# Patient Record
Sex: Male | Born: 1955 | Race: Black or African American | Hispanic: No | Marital: Married | Smoking: Former smoker
Health system: Southern US, Community
[De-identification: ages and names within clinical notes are randomized; demographics above are authoritative.]

## PROBLEM LIST (undated history)

## (undated) DIAGNOSIS — I10 Essential (primary) hypertension: Secondary | ICD-10-CM

## (undated) DIAGNOSIS — L723 Sebaceous cyst: Secondary | ICD-10-CM

## (undated) DIAGNOSIS — E119 Type 2 diabetes mellitus without complications: Secondary | ICD-10-CM

## (undated) DIAGNOSIS — M1990 Unspecified osteoarthritis, unspecified site: Secondary | ICD-10-CM

## (undated) DIAGNOSIS — K37 Unspecified appendicitis: Secondary | ICD-10-CM

## (undated) HISTORY — PX: COLON SURGERY: SHX602

## (undated) HISTORY — DX: Sebaceous cyst: L72.3

## (undated) HISTORY — DX: Unspecified appendicitis: K37

## (undated) HISTORY — PX: JOINT REPLACEMENT: SHX530

---

## 2008-08-11 DIAGNOSIS — E291 Testicular hypofunction: Secondary | ICD-10-CM

## 2011-06-28 DIAGNOSIS — E1169 Type 2 diabetes mellitus with other specified complication: Secondary | ICD-10-CM | POA: Insufficient documentation

## 2011-06-28 DIAGNOSIS — I10 Essential (primary) hypertension: Secondary | ICD-10-CM | POA: Insufficient documentation

## 2011-06-28 DIAGNOSIS — E669 Obesity, unspecified: Secondary | ICD-10-CM | POA: Insufficient documentation

## 2011-06-28 DIAGNOSIS — E119 Type 2 diabetes mellitus without complications: Secondary | ICD-10-CM | POA: Insufficient documentation

## 2011-06-28 DIAGNOSIS — IMO0001 Reserved for inherently not codable concepts without codable children: Secondary | ICD-10-CM | POA: Insufficient documentation

## 2012-04-22 HISTORY — PX: REPAIR OF PERFORATED ULCER: SHX6065

## 2012-04-22 HISTORY — PX: BOWEL RESECTION: SHX1257

## 2012-07-31 ENCOUNTER — Emergency Department: Payer: Self-pay | Admitting: Emergency Medicine

## 2012-07-31 LAB — COMPREHENSIVE METABOLIC PANEL
Alkaline Phosphatase: 68 U/L (ref 50–136)
Anion Gap: 8 (ref 7–16)
BUN: 29 mg/dL — ABNORMAL HIGH (ref 7–18)
Calcium, Total: 8.8 mg/dL (ref 8.5–10.1)
Chloride: 105 mmol/L (ref 98–107)
Co2: 25 mmol/L (ref 21–32)
Creatinine: 1.94 mg/dL — ABNORMAL HIGH (ref 0.60–1.30)
EGFR (African American): 44 — ABNORMAL LOW
EGFR (Non-African Amer.): 38 — ABNORMAL LOW
Potassium: 4 mmol/L (ref 3.5–5.1)
SGOT(AST): 18 U/L (ref 15–37)
SGPT (ALT): 23 U/L (ref 12–78)
Total Protein: 7 g/dL (ref 6.4–8.2)

## 2012-07-31 LAB — CBC
HCT: 37.5 % — ABNORMAL LOW (ref 40.0–52.0)
MCH: 29.9 pg (ref 26.0–34.0)
MCV: 89 fL (ref 80–100)
Platelet: 252 10*3/uL (ref 150–440)
RBC: 4.22 10*6/uL — ABNORMAL LOW (ref 4.40–5.90)
RDW: 14.6 % — ABNORMAL HIGH (ref 11.5–14.5)
WBC: 6.6 10*3/uL (ref 3.8–10.6)

## 2012-07-31 LAB — CK TOTAL AND CKMB (NOT AT ARMC): CK-MB: 2.9 ng/mL (ref 0.5–3.6)

## 2012-07-31 LAB — LIPASE, BLOOD: Lipase: 70 U/L — ABNORMAL LOW (ref 73–393)

## 2013-01-08 DIAGNOSIS — N529 Male erectile dysfunction, unspecified: Secondary | ICD-10-CM | POA: Insufficient documentation

## 2013-01-20 ENCOUNTER — Inpatient Hospital Stay: Payer: Self-pay | Admitting: Surgery

## 2013-01-20 LAB — URINALYSIS, COMPLETE
Bilirubin,UR: NEGATIVE
Blood: NEGATIVE
Leukocyte Esterase: NEGATIVE
Nitrite: NEGATIVE
Ph: 5 (ref 4.5–8.0)
Protein: 30
RBC,UR: <1 /HPF (ref 0–5)
Specific Gravity: 1.016 (ref 1.003–1.030)

## 2013-01-20 LAB — COMPREHENSIVE METABOLIC PANEL
Albumin: 3.1 g/dL — ABNORMAL LOW (ref 3.4–5.0)
Alkaline Phosphatase: 155 U/L — ABNORMAL HIGH (ref 50–136)
Anion Gap: 8 (ref 7–16)
BUN: 26 mg/dL — ABNORMAL HIGH (ref 7–18)
Calcium, Total: 9.7 mg/dL (ref 8.5–10.1)
Chloride: 102 mmol/L (ref 98–107)
Co2: 25 mmol/L (ref 21–32)
EGFR (African American): 56 — ABNORMAL LOW
EGFR (Non-African Amer.): 49 — ABNORMAL LOW
Glucose: 139 mg/dL — ABNORMAL HIGH (ref 65–99)
Potassium: 4.1 mmol/L (ref 3.5–5.1)
SGOT(AST): 33 U/L (ref 15–37)
SGPT (ALT): 52 U/L (ref 12–78)
Sodium: 135 mmol/L — ABNORMAL LOW (ref 136–145)
Total Protein: 8.2 g/dL (ref 6.4–8.2)

## 2013-01-20 LAB — CBC
HCT: 32.8 % — ABNORMAL LOW (ref 40.0–52.0)
HGB: 10.9 g/dL — ABNORMAL LOW (ref 13.0–18.0)
MCV: 83 fL (ref 80–100)
Platelet: 430 10*3/uL (ref 150–440)
RBC: 3.94 10*6/uL — ABNORMAL LOW (ref 4.40–5.90)
RDW: 17.5 % — ABNORMAL HIGH (ref 11.5–14.5)

## 2013-01-21 LAB — BASIC METABOLIC PANEL
Anion Gap: 7 (ref 7–16)
Calcium, Total: 9.1 mg/dL (ref 8.5–10.1)
Chloride: 100 mmol/L (ref 98–107)
Co2: 25 mmol/L (ref 21–32)
Creatinine: 1.5 mg/dL — ABNORMAL HIGH (ref 0.60–1.30)
EGFR (African American): 59 — ABNORMAL LOW
EGFR (Non-African Amer.): 51 — ABNORMAL LOW
Glucose: 246 mg/dL — ABNORMAL HIGH (ref 65–99)
Osmolality: 275 (ref 275–301)

## 2013-01-21 LAB — CBC WITH DIFFERENTIAL/PLATELET
Basophil %: 0.2 %
Eosinophil #: 0.2 10*3/uL (ref 0.0–0.7)
HCT: 32.2 % — ABNORMAL LOW (ref 40.0–52.0)
MCV: 84 fL (ref 80–100)
Monocyte #: 2 x10 3/mm — ABNORMAL HIGH (ref 0.2–1.0)
Monocyte %: 10.4 %
Neutrophil #: 15.5 10*3/uL — ABNORMAL HIGH (ref 1.4–6.5)
Neutrophil %: 82.3 %
RBC: 3.85 10*6/uL — ABNORMAL LOW (ref 4.40–5.90)
RDW: 17.2 % — ABNORMAL HIGH (ref 11.5–14.5)
WBC: 18.8 10*3/uL — ABNORMAL HIGH (ref 3.8–10.6)

## 2013-01-21 LAB — WBC: WBC: 18.3 10*3/uL — ABNORMAL HIGH (ref 3.8–10.6)

## 2013-01-21 LAB — CLOSTRIDIUM DIFFICILE BY PCR

## 2013-01-22 LAB — BASIC METABOLIC PANEL
Calcium, Total: 8.6 mg/dL (ref 8.5–10.1)
Chloride: 104 mmol/L (ref 98–107)
Co2: 25 mmol/L (ref 21–32)
EGFR (African American): >60
EGFR (Non-African Amer.): 54 — ABNORMAL LOW
Glucose: 173 mg/dL — ABNORMAL HIGH (ref 65–99)
Osmolality: 276 (ref 275–301)
Potassium: 4 mmol/L (ref 3.5–5.1)
Sodium: 136 mmol/L (ref 136–145)

## 2013-01-22 LAB — CBC WITH DIFFERENTIAL/PLATELET
Basophil #: 0.1 10*3/uL (ref 0.0–0.1)
Basophil %: 0.3 %
Eosinophil #: 0.2 10*3/uL (ref 0.0–0.7)
Eosinophil %: 1.4 %
HCT: 28.4 % — ABNORMAL LOW (ref 40.0–52.0)
HGB: 9.4 g/dL — ABNORMAL LOW (ref 13.0–18.0)
Lymphocyte #: 1.3 10*3/uL (ref 1.0–3.6)
Lymphocyte %: 7.7 %
MCH: 27.5 pg (ref 26.0–34.0)
Monocyte #: 2.2 x10 3/mm — ABNORMAL HIGH (ref 0.2–1.0)
Neutrophil %: 77.9 %
Platelet: 431 10*3/uL (ref 150–440)
RDW: 17.3 % — ABNORMAL HIGH (ref 11.5–14.5)

## 2013-01-22 LAB — PROTIME-INR
INR: 1.2
Prothrombin Time: 15.7 secs — ABNORMAL HIGH (ref 11.5–14.7)

## 2013-01-23 LAB — CBC WITH DIFFERENTIAL/PLATELET
Basophil #: 0 10*3/uL (ref 0.0–0.1)
Basophil %: 0.2 %
Eosinophil #: 0 10*3/uL (ref 0.0–0.7)
Eosinophil %: 0 %
HCT: 34.1 % — ABNORMAL LOW (ref 40.0–52.0)
HGB: 11 g/dL — ABNORMAL LOW (ref 13.0–18.0)
Lymphocyte %: 3.4 %
MCHC: 32.4 g/dL (ref 32.0–36.0)
MCV: 84 fL (ref 80–100)
Monocyte #: 2.1 x10 3/mm — ABNORMAL HIGH (ref 0.2–1.0)
Monocyte %: 9.3 %
Neutrophil #: 19.7 10*3/uL — ABNORMAL HIGH (ref 1.4–6.5)
Neutrophil %: 87.1 %
Platelet: 534 10*3/uL — ABNORMAL HIGH (ref 150–440)
RBC: 4.06 10*6/uL — ABNORMAL LOW (ref 4.40–5.90)
RDW: 17.6 % — ABNORMAL HIGH (ref 11.5–14.5)
WBC: 22.6 10*3/uL — ABNORMAL HIGH (ref 3.8–10.6)

## 2013-01-23 LAB — COMPREHENSIVE METABOLIC PANEL
Albumin: 2.1 g/dL — ABNORMAL LOW (ref 3.4–5.0)
BUN: 20 mg/dL — ABNORMAL HIGH (ref 7–18)
Bilirubin,Total: 0.4 mg/dL (ref 0.2–1.0)
Chloride: 106 mmol/L (ref 98–107)
Co2: 20 mmol/L — ABNORMAL LOW (ref 21–32)
Creatinine: 1.67 mg/dL — ABNORMAL HIGH (ref 0.60–1.30)
EGFR (Non-African Amer.): 45 — ABNORMAL LOW
Glucose: 256 mg/dL — ABNORMAL HIGH (ref 65–99)
Osmolality: 283 (ref 275–301)
Potassium: 5.1 mmol/L (ref 3.5–5.1)
SGOT(AST): 22 U/L (ref 15–37)
SGPT (ALT): 26 U/L (ref 12–78)
Sodium: 136 mmol/L (ref 136–145)

## 2013-01-24 LAB — CBC WITH DIFFERENTIAL/PLATELET
Basophil #: 0 10*3/uL (ref 0.0–0.1)
Eosinophil #: 0.2 10*3/uL (ref 0.0–0.7)
Eosinophil %: 0.9 %
Lymphocyte #: 1.1 10*3/uL (ref 1.0–3.6)
Lymphocyte %: 5.9 %
MCH: 28.1 pg (ref 26.0–34.0)
Monocyte #: 1.8 x10 3/mm — ABNORMAL HIGH (ref 0.2–1.0)
Monocyte %: 9.9 %
Neutrophil #: 15.2 10*3/uL — ABNORMAL HIGH (ref 1.4–6.5)
Neutrophil %: 83.1 %
RBC: 3.47 10*6/uL — ABNORMAL LOW (ref 4.40–5.90)
WBC: 18.3 10*3/uL — ABNORMAL HIGH (ref 3.8–10.6)

## 2013-01-24 LAB — STOOL CULTURE

## 2013-01-24 LAB — BASIC METABOLIC PANEL
Anion Gap: 9 (ref 7–16)
BUN: 19 mg/dL — ABNORMAL HIGH (ref 7–18)
Calcium, Total: 8.4 mg/dL — ABNORMAL LOW (ref 8.5–10.1)
Co2: 23 mmol/L (ref 21–32)
Glucose: 261 mg/dL — ABNORMAL HIGH (ref 65–99)
Osmolality: 283 (ref 275–301)

## 2013-01-25 LAB — CBC WITH DIFFERENTIAL/PLATELET
Basophil #: 0.1 10*3/uL (ref 0.0–0.1)
Basophil %: 0.3 %
Eosinophil #: 0.4 10*3/uL (ref 0.0–0.7)
Eosinophil %: 2.1 %
HGB: 9.1 g/dL — ABNORMAL LOW (ref 13.0–18.0)
Lymphocyte #: 1.4 10*3/uL (ref 1.0–3.6)
Neutrophil #: 13.2 10*3/uL — ABNORMAL HIGH (ref 1.4–6.5)
Platelet: 577 10*3/uL — ABNORMAL HIGH (ref 150–440)
RDW: 17.7 % — ABNORMAL HIGH (ref 11.5–14.5)

## 2013-01-25 LAB — BASIC METABOLIC PANEL
BUN: 19 mg/dL — ABNORMAL HIGH (ref 7–18)
Calcium, Total: 8.2 mg/dL — ABNORMAL LOW (ref 8.5–10.1)
Creatinine: 1.26 mg/dL (ref 0.60–1.30)
EGFR (Non-African Amer.): >60
Osmolality: 281 (ref 275–301)
Potassium: 4.4 mmol/L (ref 3.5–5.1)
Sodium: 135 mmol/L — ABNORMAL LOW (ref 136–145)

## 2013-01-26 LAB — CULTURE, BLOOD (SINGLE)

## 2013-01-26 LAB — PHOSPHORUS: Phosphorus: 3.2 mg/dL (ref 2.5–4.9)

## 2013-01-26 LAB — CBC WITH DIFFERENTIAL/PLATELET
Basophil %: 0.5 %
Eosinophil %: 2.7 %
HCT: 25.6 % — ABNORMAL LOW (ref 40.0–52.0)
HGB: 8.6 g/dL — ABNORMAL LOW (ref 13.0–18.0)
Lymphocyte #: 0.9 10*3/uL — ABNORMAL LOW (ref 1.0–3.6)
Lymphocyte %: 8.5 %
MCV: 84 fL (ref 80–100)
Monocyte %: 9.4 %
Neutrophil #: 8.8 10*3/uL — ABNORMAL HIGH (ref 1.4–6.5)
Neutrophil %: 78.9 %
Platelet: 538 10*3/uL — ABNORMAL HIGH (ref 150–440)
RBC: 3.06 10*6/uL — ABNORMAL LOW (ref 4.40–5.90)
RDW: 17.4 % — ABNORMAL HIGH (ref 11.5–14.5)
WBC: 11.1 10*3/uL — ABNORMAL HIGH (ref 3.8–10.6)

## 2013-01-26 LAB — POTASSIUM: Potassium: 4.2 mmol/L (ref 3.5–5.1)

## 2013-01-26 LAB — ALBUMIN: Albumin: 2 g/dL — ABNORMAL LOW (ref 3.4–5.0)

## 2013-01-26 LAB — CALCIUM: Calcium, Total: 8.1 mg/dL — ABNORMAL LOW (ref 8.5–10.1)

## 2013-01-27 LAB — CBC WITH DIFFERENTIAL/PLATELET
Basophil %: 0.3 %
Eosinophil #: 0.1 10*3/uL (ref 0.0–0.7)
Eosinophil %: 0.8 %
HCT: 29.4 % — ABNORMAL LOW (ref 40.0–52.0)
HGB: 9.6 g/dL — ABNORMAL LOW (ref 13.0–18.0)
Lymphocyte #: 0.8 10*3/uL — ABNORMAL LOW (ref 1.0–3.6)
Lymphocyte %: 5.3 %
MCHC: 32.7 g/dL (ref 32.0–36.0)
MCV: 84 fL (ref 80–100)
Monocyte %: 10.8 %
Neutrophil #: 13 10*3/uL — ABNORMAL HIGH (ref 1.4–6.5)
Neutrophil %: 82.8 %
Platelet: 606 10*3/uL — ABNORMAL HIGH (ref 150–440)
WBC: 15.7 10*3/uL — ABNORMAL HIGH (ref 3.8–10.6)

## 2013-01-27 LAB — BASIC METABOLIC PANEL
Anion Gap: 4 — ABNORMAL LOW (ref 7–16)
BUN: 13 mg/dL (ref 7–18)
Calcium, Total: 8.3 mg/dL — ABNORMAL LOW (ref 8.5–10.1)
Chloride: 106 mmol/L (ref 98–107)
Co2: 26 mmol/L (ref 21–32)
Creatinine: 1.35 mg/dL — ABNORMAL HIGH (ref 0.60–1.30)
EGFR (African American): >60
EGFR (Non-African Amer.): 58 — ABNORMAL LOW
Osmolality: 289 (ref 275–301)
Potassium: 4.9 mmol/L (ref 3.5–5.1)

## 2013-01-27 LAB — PATHOLOGY REPORT

## 2013-01-27 LAB — MAGNESIUM: Magnesium: 1.7 mg/dL — ABNORMAL LOW

## 2013-01-28 LAB — MAGNESIUM: Magnesium: 2 mg/dL

## 2013-01-28 LAB — BASIC METABOLIC PANEL
BUN: 15 mg/dL (ref 7–18)
Calcium, Total: 8.6 mg/dL (ref 8.5–10.1)
Chloride: 106 mmol/L (ref 98–107)
Co2: 30 mmol/L (ref 21–32)
Creatinine: 1.21 mg/dL (ref 0.60–1.30)
EGFR (African American): >60
Glucose: 334 mg/dL — ABNORMAL HIGH (ref 65–99)
Osmolality: 291 (ref 275–301)
Sodium: 139 mmol/L (ref 136–145)

## 2013-01-29 LAB — BASIC METABOLIC PANEL
Anion Gap: 5 — ABNORMAL LOW (ref 7–16)
BUN: 14 mg/dL (ref 7–18)
Calcium, Total: 8.8 mg/dL (ref 8.5–10.1)
Chloride: 106 mmol/L (ref 98–107)
Co2: 30 mmol/L (ref 21–32)
EGFR (Non-African Amer.): >60
Glucose: 271 mg/dL — ABNORMAL HIGH (ref 65–99)

## 2013-01-29 LAB — ALBUMIN: Albumin: 2.2 g/dL — ABNORMAL LOW (ref 3.4–5.0)

## 2013-01-30 LAB — BASIC METABOLIC PANEL
Anion Gap: 4 — ABNORMAL LOW (ref 7–16)
BUN: 13 mg/dL (ref 7–18)
Creatinine: 1.13 mg/dL (ref 0.60–1.30)
EGFR (African American): >60
Potassium: 3.9 mmol/L (ref 3.5–5.1)
Sodium: 138 mmol/L (ref 136–145)

## 2013-01-30 LAB — PHOSPHORUS: Phosphorus: 4.3 mg/dL (ref 2.5–4.9)

## 2013-01-31 LAB — CBC WITH DIFFERENTIAL/PLATELET
Basophil #: 0.1 10*3/uL (ref 0.0–0.1)
Eosinophil %: 3 %
HCT: 26.1 % — ABNORMAL LOW (ref 40.0–52.0)
HGB: 8.7 g/dL — ABNORMAL LOW (ref 13.0–18.0)
Lymphocyte %: 16.7 %
MCHC: 33.2 g/dL (ref 32.0–36.0)
Monocyte #: 0.9 x10 3/mm (ref 0.2–1.0)
Monocyte %: 10.4 %
Platelet: 491 10*3/uL — ABNORMAL HIGH (ref 150–440)
RBC: 3.13 10*6/uL — ABNORMAL LOW (ref 4.40–5.90)
WBC: 8.4 10*3/uL (ref 3.8–10.6)

## 2013-01-31 LAB — MAGNESIUM: Magnesium: 1.7 mg/dL — ABNORMAL LOW

## 2013-01-31 LAB — PHOSPHORUS: Phosphorus: 4.6 mg/dL (ref 2.5–4.9)

## 2013-01-31 LAB — CALCIUM: Calcium, Total: 8.8 mg/dL (ref 8.5–10.1)

## 2013-02-01 LAB — CALCIUM: Calcium, Total: 8.5 mg/dL (ref 8.5–10.1)

## 2013-02-01 LAB — PHOSPHORUS: Phosphorus: 4.7 mg/dL (ref 2.5–4.9)

## 2013-02-01 LAB — MAGNESIUM: Magnesium: 1.9 mg/dL

## 2013-02-03 LAB — CREATININE, SERUM
Creatinine: 1.16 mg/dL (ref 0.60–1.30)
EGFR (African American): >60
EGFR (Non-African Amer.): >60

## 2013-02-03 LAB — SODIUM: Sodium: 137 mmol/L (ref 136–145)

## 2013-02-04 LAB — CALCIUM: Calcium, Total: 8.7 mg/dL (ref 8.5–10.1)

## 2013-02-04 LAB — PHOSPHORUS: Phosphorus: 5.3 mg/dL — ABNORMAL HIGH (ref 2.5–4.9)

## 2013-02-04 LAB — CREATININE, SERUM
Creatinine: 1.28 mg/dL (ref 0.60–1.30)
EGFR (Non-African Amer.): >60

## 2013-02-04 LAB — POTASSIUM: Potassium: 4.3 mmol/L (ref 3.5–5.1)

## 2013-02-08 LAB — PATHOLOGY REPORT

## 2013-04-22 HISTORY — PX: APPENDECTOMY: SHX54

## 2013-09-27 ENCOUNTER — Inpatient Hospital Stay: Payer: Self-pay | Admitting: Surgery

## 2013-09-27 LAB — URINALYSIS, COMPLETE
Bacteria: NONE SEEN
Bilirubin,UR: NEGATIVE
Blood: NEGATIVE
Glucose,UR: >=500 mg/dL (ref 0–75)
Hyaline Cast: 6
Leukocyte Esterase: NEGATIVE
Nitrite: NEGATIVE
Ph: 5 (ref 4.5–8.0)
Protein: 30
RBC,UR: 2 /HPF (ref 0–5)
SPECIFIC GRAVITY: 1.026 (ref 1.003–1.030)
WBC UR: 4 /HPF (ref 0–5)

## 2013-09-27 LAB — COMPREHENSIVE METABOLIC PANEL
ALT: 15 U/L (ref 12–78)
Albumin: 3.3 g/dL — ABNORMAL LOW (ref 3.4–5.0)
Alkaline Phosphatase: 64 U/L
Anion Gap: 10 (ref 7–16)
BUN: 16 mg/dL (ref 7–18)
Bilirubin,Total: 1 mg/dL (ref 0.2–1.0)
CHLORIDE: 102 mmol/L (ref 98–107)
CREATININE: 1.35 mg/dL — AB (ref 0.60–1.30)
Calcium, Total: 8.6 mg/dL (ref 8.5–10.1)
Co2: 24 mmol/L (ref 21–32)
EGFR (African American): >60
EGFR (Non-African Amer.): 58 — ABNORMAL LOW
GLUCOSE: 276 mg/dL — AB (ref 65–99)
Osmolality: 283 (ref 275–301)
POTASSIUM: 4 mmol/L (ref 3.5–5.1)
SGOT(AST): 11 U/L — ABNORMAL LOW (ref 15–37)
SODIUM: 136 mmol/L (ref 136–145)
Total Protein: 6.8 g/dL (ref 6.4–8.2)

## 2013-09-27 LAB — CBC WITH DIFFERENTIAL/PLATELET
Basophil #: 0 10*3/uL (ref 0.0–0.1)
Basophil %: 0.5 %
EOS PCT: 1.1 %
Eosinophil #: 0 10*3/uL (ref 0.0–0.7)
HCT: 40.1 % (ref 40.0–52.0)
HGB: 12.6 g/dL — ABNORMAL LOW (ref 13.0–18.0)
Lymphocyte #: 0.5 10*3/uL — ABNORMAL LOW (ref 1.0–3.6)
Lymphocyte %: 11.9 %
MCH: 28.3 pg (ref 26.0–34.0)
MCHC: 31.5 g/dL — ABNORMAL LOW (ref 32.0–36.0)
MCV: 90 fL (ref 80–100)
MONOS PCT: 11.4 %
Monocyte #: 0.5 x10 3/mm (ref 0.2–1.0)
Neutrophil #: 3.2 10*3/uL (ref 1.4–6.5)
Neutrophil %: 75.1 %
Platelet: 261 10*3/uL (ref 150–440)
RBC: 4.45 10*6/uL (ref 4.40–5.90)
RDW: 15.8 % — AB (ref 11.5–14.5)
WBC: 4.3 10*3/uL (ref 3.8–10.6)

## 2013-09-27 LAB — LIPASE, BLOOD: LIPASE: 81 U/L (ref 73–393)

## 2013-09-27 LAB — TROPONIN I: Troponin-I: <0.02 ng/mL

## 2013-09-28 LAB — COMPREHENSIVE METABOLIC PANEL
ALBUMIN: 2.8 g/dL — AB (ref 3.4–5.0)
AST: 15 U/L (ref 15–37)
Alkaline Phosphatase: 55 U/L
Anion Gap: 7 (ref 7–16)
BILIRUBIN TOTAL: 1.1 mg/dL — AB (ref 0.2–1.0)
BUN: 18 mg/dL (ref 7–18)
CHLORIDE: 102 mmol/L (ref 98–107)
CO2: 26 mmol/L (ref 21–32)
CREATININE: 1.67 mg/dL — AB (ref 0.60–1.30)
Calcium, Total: 8.4 mg/dL — ABNORMAL LOW (ref 8.5–10.1)
EGFR (African American): 52 — ABNORMAL LOW
GFR CALC NON AF AMER: 45 — AB
Glucose: 230 mg/dL — ABNORMAL HIGH (ref 65–99)
Osmolality: 279 (ref 275–301)
Potassium: 4 mmol/L (ref 3.5–5.1)
SGPT (ALT): 14 U/L (ref 12–78)
SODIUM: 135 mmol/L — AB (ref 136–145)
Total Protein: 6.7 g/dL (ref 6.4–8.2)

## 2013-09-28 LAB — CBC WITH DIFFERENTIAL/PLATELET
BASOS PCT: 0.2 %
Basophil #: 0 10*3/uL (ref 0.0–0.1)
EOS ABS: 0.1 10*3/uL (ref 0.0–0.7)
Eosinophil %: 0.7 %
HCT: 35.5 % — ABNORMAL LOW (ref 40.0–52.0)
HGB: 11.4 g/dL — AB (ref 13.0–18.0)
Lymphocyte #: 0.7 10*3/uL — ABNORMAL LOW (ref 1.0–3.6)
Lymphocyte %: 7.6 %
MCH: 28.6 pg (ref 26.0–34.0)
MCHC: 32.1 g/dL (ref 32.0–36.0)
MCV: 89 fL (ref 80–100)
Monocyte #: 0.9 x10 3/mm (ref 0.2–1.0)
Monocyte %: 10 %
NEUTROS ABS: 7.3 10*3/uL — AB (ref 1.4–6.5)
Neutrophil %: 81.5 %
PLATELETS: 233 10*3/uL (ref 150–440)
RBC: 3.98 10*6/uL — ABNORMAL LOW (ref 4.40–5.90)
RDW: 15.5 % — ABNORMAL HIGH (ref 11.5–14.5)
WBC: 9 10*3/uL (ref 3.8–10.6)

## 2013-09-29 LAB — CBC WITH DIFFERENTIAL/PLATELET
BASOS PCT: 0.1 %
Basophil #: 0 10*3/uL (ref 0.0–0.1)
Eosinophil #: 0.1 10*3/uL (ref 0.0–0.7)
Eosinophil %: 0.8 %
HCT: 31.6 % — ABNORMAL LOW (ref 40.0–52.0)
HGB: 10.3 g/dL — ABNORMAL LOW (ref 13.0–18.0)
LYMPHS ABS: 0.7 10*3/uL — AB (ref 1.0–3.6)
Lymphocyte %: 6.6 %
MCH: 29 pg (ref 26.0–34.0)
MCHC: 32.6 g/dL (ref 32.0–36.0)
MCV: 89 fL (ref 80–100)
MONOS PCT: 9.1 %
Monocyte #: 1 x10 3/mm (ref 0.2–1.0)
Neutrophil #: 8.9 10*3/uL — ABNORMAL HIGH (ref 1.4–6.5)
Neutrophil %: 83.4 %
Platelet: 212 10*3/uL (ref 150–440)
RBC: 3.55 10*6/uL — ABNORMAL LOW (ref 4.40–5.90)
RDW: 15.5 % — ABNORMAL HIGH (ref 11.5–14.5)
WBC: 10.7 10*3/uL — AB (ref 3.8–10.6)

## 2013-09-29 LAB — BASIC METABOLIC PANEL
Anion Gap: 6 — ABNORMAL LOW (ref 7–16)
BUN: 14 mg/dL (ref 7–18)
CALCIUM: 8.1 mg/dL — AB (ref 8.5–10.1)
CHLORIDE: 102 mmol/L (ref 98–107)
Co2: 27 mmol/L (ref 21–32)
Creatinine: 1.53 mg/dL — ABNORMAL HIGH (ref 0.60–1.30)
EGFR (Non-African Amer.): 50 — ABNORMAL LOW
GFR CALC AF AMER: 58 — AB
Glucose: 247 mg/dL — ABNORMAL HIGH (ref 65–99)
OSMOLALITY: 279 (ref 275–301)
Potassium: 3.8 mmol/L (ref 3.5–5.1)
Sodium: 135 mmol/L — ABNORMAL LOW (ref 136–145)

## 2013-09-30 LAB — BASIC METABOLIC PANEL
Anion Gap: 8 (ref 7–16)
BUN: 11 mg/dL (ref 7–18)
CALCIUM: 8 mg/dL — AB (ref 8.5–10.1)
CHLORIDE: 104 mmol/L (ref 98–107)
CO2: 26 mmol/L (ref 21–32)
CREATININE: 1.31 mg/dL — AB (ref 0.60–1.30)
Glucose: 217 mg/dL — ABNORMAL HIGH (ref 65–99)
OSMOLALITY: 282 (ref 275–301)
POTASSIUM: 3.9 mmol/L (ref 3.5–5.1)
SODIUM: 138 mmol/L (ref 136–145)

## 2013-09-30 LAB — CBC WITH DIFFERENTIAL/PLATELET
Basophil #: 0 10*3/uL (ref 0.0–0.1)
Basophil %: 0.3 %
EOS ABS: 0.2 10*3/uL (ref 0.0–0.7)
EOS PCT: 1.7 %
HCT: 30.8 % — ABNORMAL LOW (ref 40.0–52.0)
HGB: 10.1 g/dL — AB (ref 13.0–18.0)
LYMPHS ABS: 0.8 10*3/uL — AB (ref 1.0–3.6)
Lymphocyte %: 7 %
MCH: 28.9 pg (ref 26.0–34.0)
MCHC: 32.8 g/dL (ref 32.0–36.0)
MCV: 88 fL (ref 80–100)
MONOS PCT: 9.6 %
Monocyte #: 1.1 x10 3/mm — ABNORMAL HIGH (ref 0.2–1.0)
NEUTROS ABS: 9.3 10*3/uL — AB (ref 1.4–6.5)
NEUTROS PCT: 81.4 %
PLATELETS: 231 10*3/uL (ref 150–440)
RBC: 3.49 10*6/uL — ABNORMAL LOW (ref 4.40–5.90)
RDW: 15.6 % — ABNORMAL HIGH (ref 11.5–14.5)
WBC: 11.5 10*3/uL — ABNORMAL HIGH (ref 3.8–10.6)

## 2013-10-01 LAB — BASIC METABOLIC PANEL
ANION GAP: 9 (ref 7–16)
BUN: 13 mg/dL (ref 7–18)
CALCIUM: 8.2 mg/dL — AB (ref 8.5–10.1)
CHLORIDE: 102 mmol/L (ref 98–107)
Co2: 25 mmol/L (ref 21–32)
Creatinine: 1.45 mg/dL — ABNORMAL HIGH (ref 0.60–1.30)
GFR CALC NON AF AMER: 53 — AB
Glucose: 304 mg/dL — ABNORMAL HIGH (ref 65–99)
Osmolality: 283 (ref 275–301)
Potassium: 3.7 mmol/L (ref 3.5–5.1)
Sodium: 136 mmol/L (ref 136–145)

## 2013-10-01 LAB — CBC WITH DIFFERENTIAL/PLATELET
BASOS ABS: 0 10*3/uL (ref 0.0–0.1)
BASOS PCT: 0.3 %
EOS PCT: 4.1 %
Eosinophil #: 0.3 10*3/uL (ref 0.0–0.7)
HCT: 28.9 % — ABNORMAL LOW (ref 40.0–52.0)
HGB: 9.5 g/dL — AB (ref 13.0–18.0)
LYMPHS PCT: 14.8 %
Lymphocyte #: 1.1 10*3/uL (ref 1.0–3.6)
MCH: 29 pg (ref 26.0–34.0)
MCHC: 32.8 g/dL (ref 32.0–36.0)
MCV: 89 fL (ref 80–100)
MONO ABS: 1 x10 3/mm (ref 0.2–1.0)
Monocyte %: 13.2 %
Neutrophil #: 5 10*3/uL (ref 1.4–6.5)
Neutrophil %: 67.6 %
Platelet: 228 10*3/uL (ref 150–440)
RBC: 3.26 10*6/uL — ABNORMAL LOW (ref 4.40–5.90)
RDW: 15.6 % — AB (ref 11.5–14.5)
WBC: 7.5 10*3/uL (ref 3.8–10.6)

## 2013-10-02 LAB — CULTURE, BLOOD (SINGLE)

## 2013-10-03 LAB — CBC WITH DIFFERENTIAL/PLATELET
BASOS ABS: 0 10*3/uL (ref 0.0–0.1)
BASOS PCT: 0.4 %
Eosinophil #: 0.3 10*3/uL (ref 0.0–0.7)
Eosinophil %: 3.1 %
HCT: 28.1 % — ABNORMAL LOW (ref 40.0–52.0)
HGB: 9.3 g/dL — ABNORMAL LOW (ref 13.0–18.0)
LYMPHS ABS: 1.6 10*3/uL (ref 1.0–3.6)
LYMPHS PCT: 19.4 %
MCH: 29.1 pg (ref 26.0–34.0)
MCHC: 33.1 g/dL (ref 32.0–36.0)
MCV: 88 fL (ref 80–100)
MONO ABS: 1 x10 3/mm (ref 0.2–1.0)
Monocyte %: 12.4 %
Neutrophil #: 5.3 10*3/uL (ref 1.4–6.5)
Neutrophil %: 64.7 %
Platelet: 275 10*3/uL (ref 150–440)
RBC: 3.19 10*6/uL — ABNORMAL LOW (ref 4.40–5.90)
RDW: 15.5 % — ABNORMAL HIGH (ref 11.5–14.5)
WBC: 8.1 10*3/uL (ref 3.8–10.6)

## 2013-10-03 LAB — BASIC METABOLIC PANEL
Anion Gap: 7 (ref 7–16)
BUN: 10 mg/dL (ref 7–18)
CALCIUM: 8.1 mg/dL — AB (ref 8.5–10.1)
CHLORIDE: 104 mmol/L (ref 98–107)
Co2: 29 mmol/L (ref 21–32)
Creatinine: 1.57 mg/dL — ABNORMAL HIGH (ref 0.60–1.30)
EGFR (African American): 56 — ABNORMAL LOW
EGFR (Non-African Amer.): 48 — ABNORMAL LOW
Glucose: 175 mg/dL — ABNORMAL HIGH (ref 65–99)
Osmolality: 283 (ref 275–301)
POTASSIUM: 3.4 mmol/L — AB (ref 3.5–5.1)
Sodium: 140 mmol/L (ref 136–145)

## 2013-10-07 ENCOUNTER — Other Ambulatory Visit: Payer: Self-pay | Admitting: Surgery

## 2013-10-07 LAB — CBC WITH DIFFERENTIAL/PLATELET
BASOS PCT: 1.1 %
Basophil #: 0.1 10*3/uL (ref 0.0–0.1)
EOS PCT: 1.6 %
Eosinophil #: 0.2 10*3/uL (ref 0.0–0.7)
HCT: 31.4 % — ABNORMAL LOW (ref 40.0–52.0)
HGB: 10.2 g/dL — ABNORMAL LOW (ref 13.0–18.0)
Lymphocyte #: 1.5 10*3/uL (ref 1.0–3.6)
Lymphocyte %: 12.6 %
MCH: 28.6 pg (ref 26.0–34.0)
MCHC: 32.4 g/dL (ref 32.0–36.0)
MCV: 88 fL (ref 80–100)
MONOS PCT: 4.9 %
Monocyte #: 0.6 x10 3/mm (ref 0.2–1.0)
NEUTROS ABS: 9.3 10*3/uL — AB (ref 1.4–6.5)
NEUTROS PCT: 79.8 %
PLATELETS: 458 10*3/uL — AB (ref 150–440)
RBC: 3.56 10*6/uL — ABNORMAL LOW (ref 4.40–5.90)
RDW: 15.4 % — AB (ref 11.5–14.5)
WBC: 11.6 10*3/uL — AB (ref 3.8–10.6)

## 2013-10-07 LAB — BASIC METABOLIC PANEL
Anion Gap: 5 — ABNORMAL LOW (ref 7–16)
BUN: 16 mg/dL (ref 7–18)
CREATININE: 1.37 mg/dL — AB (ref 0.60–1.30)
Calcium, Total: 8.8 mg/dL (ref 8.5–10.1)
Chloride: 107 mmol/L (ref 98–107)
Co2: 26 mmol/L (ref 21–32)
EGFR (African American): >60
GFR CALC NON AF AMER: 57 — AB
Glucose: 233 mg/dL — ABNORMAL HIGH (ref 65–99)
OSMOLALITY: 284 (ref 275–301)
POTASSIUM: 4.5 mmol/L (ref 3.5–5.1)
Sodium: 138 mmol/L (ref 136–145)

## 2013-10-08 ENCOUNTER — Ambulatory Visit: Payer: Self-pay | Admitting: Surgery

## 2013-11-04 DIAGNOSIS — Z86010 Personal history of colonic polyps: Secondary | ICD-10-CM | POA: Insufficient documentation

## 2014-07-14 DIAGNOSIS — R350 Frequency of micturition: Secondary | ICD-10-CM | POA: Insufficient documentation

## 2014-08-12 NOTE — Consult Note (Signed)
Pt with abcess with tissue showing eosinophil infiltration.  His peripheral blood does not show eosinophils to be increased.  If he has diarrhea of significance in recovery phase of his surgery I would recommend a flex sig and biopsy of mucosa.  Otherwise I would not do anything else at this time.  This is a clinical problem I have not seen before and will try to research over the weekend.  Electronic Signatures: Scot JunElliott, Robert T (MD)  (Signed on 10-Oct-14 17:29)  Authored  Last Updated: 10-Oct-14 17:29 by Scot JunElliott, Robert T (MD)

## 2014-08-12 NOTE — Consult Note (Signed)
CC: eosinophilic infiltrative abcess.  Will have partner Dr. Marva PandaSkulskie do a flex sign tomorrow with biopsy of rectal mucosa to check for eosinophilic gastroenteritis.  Electronic Signatures: Scot JunElliott, Damarie Schoolfield T (MD)  (Signed on 15-Oct-14 15:34)  Authored  Last Updated: 15-Oct-14 15:34 by Scot JunElliott, Jonn Chaikin T (MD)

## 2014-08-12 NOTE — Op Note (Signed)
PATIENT NAME:  Rennis PettyURNER, Antawn V MR#:  [id number] DATE OF BIRTH:  [birth year]  DATE OF PROCEDURE:  01/26/2013  PREOPERATIVE DIAGNOSIS: Perforated viscus.   POSTOPERATIVE DIAGNOSIS:  Perforated viscus.  PROCEDURE PERFORMED: Oversew of small enterotomy with Cheree DittoGraham patch, fibrin glue application, placement of retention sutures and reopening of recent laparotomy.   SURGEON:  Natale LayMark Mckaylie Vasey, M.D. FACS  ASSISTANT: Ida Roguehristopher Lundquist. MD, FACS  TYPE OF ANESTHESIA: General endotracheal.   FINDINGS: See description below.  DESCRIPTION OF PROCEDURE: With informed consent, supine position, general oral endotracheal anesthesia was induced. A Foley catheter was placed. The abdomen and existing JPs were sterilely prepped and draped with ChloraPrep solution. Timeout was observed.   The existing staples were removed. The fascia was opened carefully with division of the previously placed PDS suture. A self-retaining abdominal wall retractor was placed. Initial examination demonstrated no evidence of gross contamination of the abdomen. There was an intense inflammatory reaction seen within the omentum. Examination of the previous enteroenterostomy demonstrated no evidence of enteric leak. We gently peeled back, with finger fracture technique, forming adhesions between bowel loops, discovering stuck in the deep recess of the midabdomen along the midline, a loop of intestine which appeared to be leaking a small amount of bile directly into the JP. JPs were amputated near the skin and removed. The area was irrigated. There was fibrinopurulent and bilious staining of the bowel in this area. There was a 3 mm opening in the small bowel. Mobilization of this bowel was not feasible due to the intense inflammatory reaction and foreshortening of the mesentery in this area from the previous abscess drainage and operative intervention.   We elected to close this hole primarily with seromuscular 3-0 silk sutures. Four such sutures  were placed. An omental flap was taken by division of tongue of omentum approximately 3 cm wide with several clamps and ties of #0 Vicryl suture. This easily was placed into the area of the repair. Fibrin glue was applied to the repair. The tongue of omentum was then sutured in place to several loops of bowel, which were fixed in their position utilizing seromuscular 3-0 silk suture. A Jackson-Pratt drain was directed from the right side into this area but not directly over the repair. The abdomen was copiously irrigated and aspirated dry. The fascia was reapproximated utilizing running looped #1 PDS suture. Retention sutures of 2-0 nylon were placed and tied over bolsters. Subcutaneous tissues were irrigated. Skin edges were reapproximated utilizing a skin stapler. Sterile dressing was applied, and the patient was returned extubated and taken to the recovery room in stable and satisfactory condition by anesthesia services.      ____________________________ Redge GainerMark A. Egbert GaribaldiBird, MD mab:dmm D: 01/27/2013 10:19:43 ET T: 01/27/2013 10:24:39 ET JOB#: [id number]  cc: Loraine LericheMark A. Egbert GaribaldiBird, MD, <Dictator> Raynald KempMARK A Sylvi Rybolt MD ELECTRONICALLY SIGNED 02/04/2013 14:43

## 2014-08-12 NOTE — Consult Note (Signed)
PATIENT NAME:  Edwin Reed, ALEXA MR#:  [id number] DATE OF BIRTH:  [birth year]  DATE OF CONSULTATION:  01/21/2013  REFERRING PHYSICIAN:  Dr. Allena Katz CONSULTING PHYSICIAN:  Lynnae Prude, MD / Ranae Plumber. Arvilla Market, ANP (Adult Nurse Practitioner)  REASON FOR CONSULTATION: Severe enteritis.   HISTORY OF PRESENT ILLNESS: This 59 year old African American patient of Dr. Donell Sievert at [facility] has a history of diabetes mellitus, remote alcoholism, colon polyps, and presents to the ER with episode of abdominal pain, constipation, low-grade fever, chills and leukocytosis. He was found on CT of the abdomen and pelvis with oral contrast to have severe bowel wall thickening involving the small bowel, in the mid abdomen, with severe surrounding inflammatory changes concerning for severe enteritis and phlegmon, but an underlying mass is not excluded. This patient has been placed on antibiotic therapy and GI has been asked to see him for further evaluation and management.   This patient says he has had 2 colonoscopies in the past at [facility]  one at age 46 and another at age 46 notable for two polyps removed each time.  He reports normal bowel health at baseline until three months ago when he awoke at 0100 with nausea, stomach pain and presented the next day to the Emergency Room. He says he was treated for constipation with laxatives for a few days and symptoms resolved. He says he returned back to normal baseline bowel habits, which is mild constipation, requiring daily MiraLax.   Then,  2 months later he had another episode of abdominal pain, discomfort and nausea, but he stayed at home this time and utilized laxatives. It took about 4 or 5 days for his stomach pain to ease off, his bowels to start moving again. He  did well until last Friday. Once again,  he developed abdominal discomfort, constipation, pain escalated, some nausea and gagging but no frank vomiting. He reports weight loss from 285 to 271 over the last week. Last  bowel movement was six days ago.  PAST MEDICAL HISTORY: 1.  Diabetes mellitus, type II, diagnosed about 20 years ago.  2.  Hypertension.  3.  Hyperlipidemia.  4.  Alcoholism, discontinued 10 years ago.  5.  Tobacco abuse, up to 2 packs per day, discontinued 10 years ago.  6.  Personal history of colon polyps. Reports colonoscopy at age 61. Due again for repeat in 5 years.   PAST SURGICAL HISTORY: Denied.   MEDICATIONS: 1.  Atorvastatin 80 mg daily.  2.  Aspirin 81 mg daily.  3.  Amlodipine 10 mg daily.  4.  Enalapril 20 mg twice daily.  5.  Flonase 2 sprays nasally once a day, currently not using.  6.  Hydrochlorothiazide 25 mg daily.  7.  Metformin 1000 mg twice daily.  8.  Metoprolol 50 mg 1-1/2 tablets twice daily.  9.  Novolin 70/30, 40 units in the morning and 30 units in the evening.  10.  Viagra 100 mg as needed.  11.  Ibuprofen 2 tablets every other day as needed for knee pain.  12.  Mobic, dose unknown, for knee pain. Has been taking for 1 month.   ALLERGIES: No known drug allergies.   FAMILY HISTORY: Positive for hypertension. Negative for colon cancer, except grandmother had some type of cancer.   SOCIAL HISTORY: Nonsmoker, quit 10 years ago. Was up to 2 packs per day for several decades. Self-described past alcoholic. Drank everything he could for about 20 year duration. Discontinued altogether 10 years ago. No alcoholic  beverage since. He works as a Estate agentforklift operator. He is married, has children.   REVIEW OF SYSTEMS: Ten system review obtained. Positive for subjective fever, chills for 2 weeks prior to admission. He has had some fatigue and weakness. He has had decreased appetite, generalized abdominal pain and distention, started Friday. Last bowel movement was a week ago Thursday. The patient states that he did break off a tiny insulin subcutaneous needle in his lower abdomen several weeks ago. He had no sequelae from that that he could tell. Remaining 10 systems  negative.   PHYSICAL EXAMINATION: VITAL SIGNS: Temperature 100, 88, 20, 121/70, 97% room air.  GENERAL: Obese, large framed African American male who is resting in bed, looks comfortable, visiting with family. He is joking. HEENT: Atraumatic, normocephalic. Conjunctivae pink. Sclerae anicteric.  NECK: Supple without JVD, thyromegaly. HEART: Heart tones S1 and S2 without murmur, rub or gallop.  LUNGS: CTA. Respirations are eupneic.  ABDOMEN: Distended, somewhat firm, tympanic to percussion. No rigidity, rebound or guarding. Positive discomfort just above the umbilicus and slightly to the right. No discrete tenderness in the left lower abdomen.  RECTAL: Large amount of soft formed stool, which is fecal impaction. Light brown, tan-colored stool, guaiac-negative. Cannot palpate for rectal masses given the amount of stool present.  EXTREMITIES: Without edema, cyanosis or clubbing.  SKIN: Warm and dry without rash.  PSYCHIATRIC: The patient is alert, oriented. Good mood. Cooperative.  NEUROLOGIC: Grossly intact. Able to move about in the bed, assists with position changes.   LABORATORY AND DIAGNOSTICS: Admission labs 01/20/2013 notable for glucose 139, BUN 26, creatinine 1.57, sodium 135, albumin 3.1, total bilirubin 0.6, alkaline phosphatase is 155, AST 33 and ALT is 52. WBC is 21.9, hemoglobin 10.9, platelet count 430 and RDW 17.5.   Repeat laboratory studies this morning with glucose 246, BUN down to 19, creatinine 1.50 and sodium is 132. WBC 18.8, hemoglobin is 10.7, normocytic indices with MCV 84 and MCH 27.7.   CT of the abdomen and pelvis without contrast reviewed from 07/31/2012 emergency room visit notable for mild distention of small bowel loops. There is a broadly necked ventral hernia which does not appear to be resulting in incarceration of the small bowel. The colon is moderately distended with stool throughout its course. No evidence of colitis or diverticulitis. The appendix was not  discretely evaluated.   CT of the abdomen and pelvis without contrast performed 01/20/2013 shows severe bowel wall thickening involving the small bowel in the midabdomen with severe surrounding inflammatory changes. This may represent a large phlegmon versus underlying mass. No pneumoperitoneum, pneumatosis or portal venous gas. Changes concerning for severe enteritis and phlegmon, underlying mass not excluded.   IMPRESSION: The patient presents with abdominal pain, acute constipation, heme-negative stool and grossly abnormal CT showing severe bowel wall thickening, inflammatory changes concerning for severe enteritis and phlegmon. Cannot rule out underlying mass. Etiology to consider small bowel diverticulitis, foreign body such as a tiny chicken bone or fish that has perforated the small bowel, small bowel malignancy, Crohn's ileitis.  PLAN: 1.  Treat fecal impaction with Fleet enema. No laxatives orally at this time. The patient to remain n.p.o. The patient should become more comfortable with Fleet enema and treatment of this constipation. 2.  Surgical consult. The patient would be happy to see any surgeon available locally and if he were to need surgery he would like to [facility]  3.  We will add sed  rate, CRP and CEA today.  4.  Fleet Enema today.  5.  The patient is already on IV Cipro and Flagyl for slight fever and leukocytosis and will continue.  6.  No role for urgent  luminal evaluation with history of recent colonoscopy 2 years ago and current heme-negative stool.  7.  CT study in April mentioned a broadly necked ventral hernia which did not appear to be resulting in incarceration of the small bowel. The current CT study made no mention of a ventral hernia. There appears to be some type of obstruction process occurring in the small bowel with etiology to be determined.  Will await surgical opinion and laboratory studies.  Thank you for the  consultation.  8. This case was discussed with Dr. Mechele Collin in collaboration of care.   This services provided by Cala Bradford A. Arvilla Market, MS, APRN, BC, ANP under collaborative agreement with Dr. Lynnae Prude.  ____________________________ Ranae Plumber. Arvilla Market, ANP (Adult Nurse Practitioner) kam:sb D: 01/21/2013 13:47:00 ET T: 01/21/2013 14:26:18 ET JOB#: [id number]  cc: Cala Bradford A. Arvilla Market, ANP (Adult Nurse Practitioner), <Dictator> Ranae Plumber Suzette Battiest, MSN, ANP-BC Adult Nurse Practitioner ELECTRONICALLY SIGNED 01/21/2013 18:28

## 2014-08-12 NOTE — Consult Note (Signed)
CC: abd pain, fever, mass in small intestine on CT.  Possible causes, Crohn's, neoplasm, foreign body perforation (bone), diverticulitis, ischemia (unlikely) Given his CT and history I think surgical resection is the best treatment.  Spoke with Dr. Juliann PulseLundquist and NP Arvilla MarketMills and patient and wife.  Electronic Signatures: Scot JunElliott, Robert T (MD)  (Signed on 02-Oct-14 19:36)  Authored  Last Updated: 02-Oct-14 19:36 by Scot JunElliott, Robert T (MD)

## 2014-08-12 NOTE — Consult Note (Signed)
Chief Complaint:  Subjective/Chief Complaint seen for follow up of sepsis, surgery for eosinophillic abscess.  patient doing well, tolerated tap water enema earlier today.  mild abdominal discomofrt at incision, no nausea, passing flatus, tolerated regular food last night and this am.   VITAL SIGNS/ANCILLARY NOTES: **Vital Signs.:   16-Oct-14 10:46  Vital Signs Type Q 4hr  Temperature Temperature (F) 98.5  Celsius 36.9  Temperature Source oral  Pulse Pulse 83  Respirations Respirations 20  Systolic BP Systolic BP 202  Diastolic BP (mmHg) Diastolic BP (mmHg) 79  Mean BP 97  Pulse Ox % Pulse Ox % 97  Pulse Ox Activity Level  At rest  Oxygen Delivery Room Air/ 21 %   Brief Assessment:  Cardiac Regular   Respiratory clear BS   Gastrointestinal details normal mild distension, obese, bs positive, related mild tenderness at  incision line.   Lab Results: General Ref:  14-Oct-14 05:30   Prealbumin ========== TEST NAME ==========  ========= RESULTS =========  = REFERENCE RANGE =  TPN PANEL  Prealbumin Prealbumin                      [L  13 mg/dL             ]             20-40               [facility]            No: [id number]           [address], [place], [place] [address]           Lindon Romp, MD         [phone]   Result(s) reported on 03 Feb 2013 at 03:48AM.  Routine Chem:  16-Oct-14 05:38   Magnesium, Serum 1.8 (1.8-2.4 THERAPEUTIC RANGE: 4-7 mg/dL TOXIC: > 10 mg/dL  -----------------------)  Phosphorus, Serum  5.3 (Result(s) reported on 04 Feb 2013 at 06:19AM.)  Potassium, Serum 4.3 (Result(s) reported on 04 Feb 2013 at 06:19AM.)  Sodium, Serum 138 (Result(s) reported on 04 Feb 2013 at 06:19AM.)  Creatinine (comp) 1.28  eGFR (African American) >60  eGFR (Non-African American) >60 (eGFR values <51m/min/1.73 m2 may be an indication of chronic kidney disease (CKD). Calculated eGFR is useful in patients with stable renal function. The  eGFR calculation will not be reliable in acutely ill patients when serum creatinine is changing rapidly. It is not useful in  patients on dialysis. The eGFR calculation may not be applicable to patients at the low and high extremes of body sizes, pregnant women, and vegetarians.)  Calcium (Total), Serum 8.7 (Result(s) reported on 04 Feb 2013 at 06:19AM.)  Routine Coag:  14-Oct-14 05:30   INR 1.1 (INR reference interval applies to patients on anticoagulant therapy. A single INR therapeutic range for coumarins is not optimal for all indications; however, the suggested range for most indications is 2.0 - 3.0. Exceptions to the INR Reference Range may include: Prosthetic heart valves, acute myocardial infarction, prevention of myocardial infarction, and combinations of aspirin and anticoagulant. The need for a higher or lower target INR must be assessed individually. Reference: The Pharmacology and Management of the Vitamin K  antagonists: the seventh ACCP Conference on Antithrombotic and Thrombolytic Therapy. CEVOJJ.0093Sept:126 (3suppl): [id number] A HCT value >55% may artifactually increase the PT.  In one study,  the increase was an average of 25%. Reference:  "Effect on Routine and Special  Coagulation Testing Values of Citrate Anticoagulant Adjustment in Patients with High HCT Values." American Journal of Clinical Pathology 2006;126:400-405.)  Routine Hem:  12-Oct-14 05:06   Platelet Count (CBC)  491  14-Oct-14 05:30   Platelet Count (CBC) 408 (Result(s) reported on 02 Feb 2013 at 06:30AM.)   Assessment/Plan:  Assessment/Plan:  Assessment 1) s/p ex lap and partial small bowel resection, drainage of  intramesenteric abscess-eosinophillic .  S/p oversew of sb enterotomy.   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 I have discussed the risks benefits and complicatiosn of proceedure to include not limited to bleeding  infection perforation and sedation and he wishes to proceed.   Electronic Signatures: Loistine Simas (MD)  (Signed 16-Oct-14 16:04)  Authored: Chief Complaint, VITAL SIGNS/ANCILLARY NOTES, Brief Assessment, Lab Results, Assessment/Plan   Last Updated: 16-Oct-14 16:04 by Loistine Simas (MD)

## 2014-08-12 NOTE — Consult Note (Signed)
Chief Complaint:  Subjective/Chief Complaint Delayed entry from 02/05/13.  seen for eosinophilic enteritis.  doing well post surgical proceedure.   tolerating po, denies n or abdominalpain.   Brief Assessment:  Cardiac Regular   Respiratory clear BS   Gastrointestinal details normal Bowel sounds normal  No gaurding  appropriately tender,   Assessment/Plan:  Assessment/Plan:  Assessment 1) eosinophilic enteritis-ude   Plan 1) biopsies form flex sig pending.  will order IBD panel and celiac panel in am.  If uninformative, woudl do stool O+P times 3 ( can be done in fu as outpatient).   Electronic Signatures: Barnetta ChapelSkulskie, Martin (MD)  (Signed 18-Oct-14 00:21)  Authored: Chief Complaint, Brief Assessment, Assessment/Plan   Last Updated: 18-Oct-14 00:21 by Barnetta ChapelSkulskie, Martin (MD)

## 2014-08-12 NOTE — Op Note (Signed)
PATIENT NAME:  Edwin Reed, Edwin Reed MR#:  [id number] DATE OF BIRTH:  1955/10/28  DATE OF PROCEDURE:  01/22/2013  ATTENDING PHYSICIAN:  Edwin Deer A. Golda Zavalza, Edwin Reed  PREOPERATIVE DIAGNOSIS:  Mechanical bowel obstruction due to retroperitoneal abscess.   POSTOPERATIVE DIAGNOSIS:  Mechanical bowel obstruction due to retroperitoneal abscess.   PROCEDURE PERFORMED: 1.  Exploratory laparotomy.  2.  Small bowel resection approximately 30 to 40 cm with primary anastomosis.  3.  Drainage of intramesenteric/retroperitoneal abscess.   INDICATION FOR SURGERY:  Edwin Reed is a pleasant 59 year old male who presents with months of abdominal pain and 10 days of worsening abdominal pain. He was noted to have a leukocytosis as well as was nauseated and vomiting and had a large mass in his mesentery, which was concerning to be phlegmon or cancer. He had not improved and was clinically obstructed to this mass, thus I elected to bring him to the Operating Room for abscess drainage and resolution of mechanical bowel obstruction.   ASSISTANT PHYSICIAN:  Hulda Marin, Edwin Reed  ESTIMATED BLOOD LOSS:  10 mL.   COMPLICATIONS:  None.   DETAILS OF SURGERY:  Are as follows:  Edwin Reed is brought to the Operating Room suite after informed consent was obtained. He was laid supine on the Operating Room table. He was induced. Endotracheal tube was placed. General anesthesia was administered. His abdomen was then prepped and draped in standard surgical fashion. A time-out was then performed correctly identifying the patient name, operative site and procedure to be performed. An infraumbilical incision was made. This was deepened down to the fascia. The fascia was incised. The peritoneum was entered. There was a small amount of fluid and his proximal bowels were quite dilated. I was unable to eviscerate his bowels, but felt a large midline mesenteric/retroperitoneal mass. I was able to see loops of bowel, which had gone into that but  there was a big mass of bowel. It was very difficult to determine where the inflow and outflow. There were collapsed loops of small bowel distally coming out of this mass. I then proceeded to attempt to lift this mass of bowel off the retroperitoneum. I did bluntly get into a large abscess cavity for which purulence came out. I, with significant effort, was able to bring the loops of bowel to the surface. There was a small portion which devascularized in the process and there other appeared to be not suitable for anastomosis because of its inflammation from being adjacent to the bowel cavity. There was no obvious evidence of Crohn's and no obvious mass. I then elected to resect this portion as I did not feel it would make a good anastomosis. I placed a GIA 75 at the proximal and distal ends. I then used a ligature to ligate the mesentery. The 2 ends were then anastomosed using a side-to-side functional end-to-end anastomosis using the GIA 75 to create a common channel and TA 60 to close the enterotomy. I then investigated the right lower quadrant and found another abscess cavity. Although I was able to evaluate the anterior of the cecum. I was not able to visualize the appendix. It appeared to be retroperitoneal and there was purulence around there. I feared that this abscess may be due to retroperitoneal appendix rupture, but did not elect to mobilize the colon for fear of damaging it in the context of such a large small bowel excision. I thus placed two JP drains, one in the initial abscess cavity and one at the medial aspect of  the right colon. These were brought through the abdomen and sutured in place with 3-0 nylon suture. The abdomen was irrigated with a large amount of saline. The bowel was then re-entered into the abdomen. The mesenteric defect prior to this was closed with a running 3-0 silk. The abdomen was then closed with 2 running looped #1 PDS. These were tied in the middle. The skin was then closed  loosely with staples. Three Penrose drains were placed into the subcutaneous to prevent abscess formation in the context of severe contamination. The patient was then awoken, extubated and brought to the Postanesthesia Care Unit. There were no immediate complications. Needle, sponge, and instrument counts were correct at the end of the procedure.   ____________________________ Edwin Raiderhristopher A. Akoni Parton, Edwin Reed cal:jm D: 01/23/2013 09:51:10 ET T: 01/23/2013 10:13:07 ET JOB#: [id number]  cc: Edwin Deerhristopher A. Philbert Ocallaghan, Edwin Reed, <Dictator> Edwin Reed ELECTRONICALLY SIGNED 02/01/2013 21:13

## 2014-08-12 NOTE — H&P (Signed)
   Subjective/Chief Complaint Abdominal pain, nausea, weight loss   History of Present Illness [name] is a pleasant 59 yo M with a history of diabetes and HTN who presents with approx 3-4 months of worsening diffuse abdominal pain.  He says that it has gotten unbearable over the past 2 weeks.  He also reports worse nausea.  He says that he has had a 12 lb weight loss over the past two weeks.  Also with subjective fevers and chills.  Upon admission shown to have enteritis with phlegmon/mass.  WBC 21K.  No flow of contrast past inflamed area.  Last colonoscopy 2-3 years ago.   Past History Diabetes HTN Hyperlipidemia   Past Medical Health Hypertension, Diabetes Mellitus   Past Med/Surgical Hx:  Diabetes:   HTN:   ALLERGIES:  No Known Allergies:   Family and Social History:  Family History Hypertension  Diabetes Mellitus   Social History negative tobacco, negative ETOH   Review of Systems:  Subjective/Chief Complaint Abdominal pain, nausea, anorexia, weight loss, fevers/chills   Fever/Chills Yes   Cough No   Sputum No   Abdominal Pain Yes   Diarrhea No   Constipation No   Nausea/Vomiting Yes   SOB/DOE No   Chest Pain No   Dysuria No   Physical Exam:  GEN well developed, well nourished, no acute distress   HEENT pink conjunctivae, PERRL, hearing intact to voice, good dentition   RESP normal resp effort  clear BS  no use of accessory muscles   CARD regular rate  no murmur  No LE edema   ABD positive tenderness  denies Flank Tenderness  no liver/spleen enlargement  no hernia  soft  normal BS   SKIN normal to palpation, No rashes, No ulcers, skin turgor good   NEURO cranial nerves intact, negative Babinski R/L, negative rigidity, follows commands, strength:, motor/sensory function intact   PSYCH alert, A+O to time, place, person, good insight    Assessment/Admission Diagnosis [name] is a pleasant 59 yo M who presents with small bowel  enteritis/perforation/phlegmon.  Differential includes chron's with perforation, diverticulitis, malignancy and foreign body.  I fear that this will not resolve with antibiotics alone and if it does diagnosis of etiology would still be under question.  I have thus offered surgical intervention to resolve this issue.   Plan Have offered laparotomy with probable bowel resection.  Will discuss with patient in am.   Electronic Signatures: Jarvis NewcomerLundquist, Kashay Cavenaugh A (MD)  (Signed 02-Oct-14 18:43)  Authored: CHIEF COMPLAINT and HISTORY, PAST MEDICAL/SURGIAL HISTORY, ALLERGIES, FAMILY AND SOCIAL HISTORY, REVIEW OF SYSTEMS, PHYSICAL EXAM, ASSESSMENT AND PLAN   Last Updated: 02-Oct-14 18:43 by Jarvis NewcomerLundquist, Hildagarde Holleran A (MD)

## 2014-08-12 NOTE — H&P (Signed)
PATIENT NAME:  Edwin Reed, Edwin Reed MR#:  [id number] DATE OF BIRTH:  Sep 14, 1955  DATE OF ADMISSION:  01/20/2013  PRIMARY CARE PHYSICIAN:  Donell Sievert at [facility].  CHIEF COMPLAINT: Mid abdominal pain on and off for the last week to 10 days.   [name] is a 59 year old African American gentleman with history of hypertension and non-insulin-dependent diabetes on insulin, comes in with mid abdominal pain on and off for the last 2 weeks, more so since the last week.  The patient denies any diarrhea. Denies any bloody stools. His last bowel movement was about a few days ago. He has not been able to eat much because of nausea and abdominal pain. He denies any fever, but does complain of breaking out in sweats and feeling hot and cold.   In the Emergency Room, the patient was found to have elevated white count of 21,000. His CT of the abdomen shows severe enteritis.  He is receiving IV fluids and got his first dose of Cipro and Flagyl. The patient denies any antibiotic use, any sick contacts or any recent travel or eating out in a restaurant in recent weeks. He is being admitted for SIRS secondary to enteritis for further evaluation and management.   PAST MEDICAL HISTORY: 1.  Type 2 diabetes.  2.  Hypertension.  3.  Hyperlipidemia.   ALLERGIES: No known drug allergies.   MEDICATIONS: 1.  Atorvastatin 80 mg daily.  2.  Aspirin 81 mg daily.  3.  Amlodipine 10 mg daily.  4.  Enalapril 20 mg b.i.d.  5.  Flonase 2 sprays nasally once a day.  6.  Hydrochlorothiazide 25 mg daily.  7.  Metformin 1000 mg b.i.d.  8.  Metoprolol 50 mg 1-1/2 tablets b.i.d.  9.  Novolin 70/30, 40 units in the morning and 30 units in the evening.  10.  Viagra 100 mg p.o. daily as needed.   FAMILY HISTORY: Positive for hypertension.   SOCIAL HISTORY: Nonsmoker, nondrinker. Works as a Engineer, petroleum at [employer].   REVIEW OF SYSTEMS:    CONSTITUTIONAL: No fever. Positive for fatigue, weakness and abdominal pain and weight  loss.  EYES: No blurred or double vision. No glaucoma or cataracts.  EARS, NOSE, THROAT: No tinnitus, ear pain, snoring or postnasal drip.  RESPIRATORY: No cough, wheeze, hemoptysis or COPD.  CARDIOVASCULAR: No chest pain, orthopnea, edema. Positive for hypertension.  GASTROINTESTINAL:  Positive for nausea, abdominal pain and constipation. No melena or rectal bleeding.  GENITOURINARY: No dysuria, hematuria or frequency.  ENDOCRINE: No polyuria, nocturia or thyroid problems.  HEMATOLOGY: No anemia or easy bruising.  SKIN: No acne or rash.  MUSCULOSKELETAL: No swelling, gout, arthritis or limited activity.  NEUROLOGIC: No CVA, TIA, seizures or memory loss.  PSYCHIATRIC: No anxiety, depression or bipolar disorder. All other systems reviewed and negative.   PHYSICAL EXAMINATION: GENERAL: The patient is awake, alert, oriented x 3.  VITAL SIGNS: Temperature is 99, pulse is 99. Blood pressure is 145/69. Sats are 98% on room air.  HEENT: Atraumatic, normocephalic. Pupils: PERRLA. EOM intact. Oral mucosa is dry.  NECK: Supple. No JVD. No carotid bruit.  LUNGS: Clear to auscultation bilaterally. No rales, rhonchi, respiratory distress or labored breathing.  HEART: Both the heart sounds are normal. Rate, rhythm is tachycardic. No murmur heard. PMI not lateralized. Chest nontender. Good pedal pulses, good femoral pulses. No lower extremity edema.  ABDOMEN: Distended, somewhat firm.  Has diffuse tenderness throughout the abdomen. No guarding, rigidity or abdominal mass felt. Bowel  sounds are hypoactive.  NEUROLOGIC: Grossly intact cranial nerves II through XII. No motor or sensory deficit.  PSYCHIATRIC:  The patient is awake, alert, oriented x 3.  SKIN: Warm and dry. No acne or lesions noted.   White count is 21.9. Hemoglobin and hematocrit are 10.9 and 32.8. Platelet count is 430. Glucose is 139. BUN is 26. Creatinine is 1.5. Sodium is 135. Potassium 4.1. Albumin is 3.1. UA negative for UTI. CT of the  abdomen is severe bowel wall thickening involving the small bowel in the mid abdomen with severe surrounding inflammatory changes concerning for severe enteritis and a phlegmon but an underlying mass is not excluded.   ASSESSMENT: A 59 year old [name] with history of insulin-dependent diabetes mellitus and hypertension, along with hyperlipidemia, comes in with increasing mid abdominal pain, nausea and subjective fevers and was found to have:  1.  Systemic inflammatory response syndrome secondary to severe enteritis. The patient will be kept n.p.o. except meds and ice chips. Continue IV fluids. Continue IV Cipro. The case was discussed with Dr. Marva PandaSkulskie, GI consultation in the morning. Follow blood cultures and stool studies with fecal WBCs, culture and sensitivities, along with C. diff toxin.  2.  Leukocytosis secondary to severe enteritis. Follow white count.  3.  Acute renal failure secondary to poor p.o. intake and dehydration from enteritis.  We will continue IV fluids, avoid nephrotoxins, hold off on enalapril and hydrochlorothiazide.  4.  Hypertension. Continue amlodipine and metoprolol for now.  5.  Type 2 diabetes, insulin requiring. We will give sliding scale insulin. Hold off on metformin and NovoLog 70/30, given the patient is n.p.o. and elevated creatinine.  6.  Deep vein thrombosis prophylaxis, subQ heparin.  7.  Further workup according to the patient's clinical course. Hospital admission plan was discussed with the patient and the patient's wife.   TIME SPENT: 55 minutes.    ____________________________ Wylie HailSona A. Allena KatzPatel, MD sap:dmm D: 01/20/2013 19:55:23 ET T: 01/20/2013 20:23:02 ET JOB#: [id number]  cc: Korde Jeppsen A. Allena KatzPatel, MD, <Dictator> Donell SievertAARON MILLER, MD Willow OraSONA A Avelardo Reesman MD ELECTRONICALLY SIGNED 02/14/2013 10:01

## 2014-08-12 NOTE — Op Note (Signed)
PATIENT NAME:  Edwin Reed, Edwin Reed MR#:  [id number] DATE OF BIRTH:  27-Dec-1955  DATE OF PROCEDURE:  01/25/2013  PREOPERATIVE DIAGNOSES:  1.  Retroperitoneal abscess.  2.  Sepsis syndrome.   POSTOPERATIVE DIAGNOSES:  1.  Retroperitoneal abscess.  2.  Sepsis syndrome.   PROCEDURE PERFORMED:   1.  Vascular access to right basilic vein.  2.  Fluoroscopic guidance for placement of catheter.  3.  Insertion of venous catheter double-lumen PICC line, right arm.   SURGEON: Levora DredgeGregory Schnier, M.D.   INDICATION FOR PROCEDURE: Requiring IV and antibiotics greater than 5 days.   ANESTHESIA: Local.   ESTIMATED BLOOD LOSS: Minimal.   DESCRIPTION OF PROCEDURE: The patient's right arm was sterilely prepped and draped, and a sterile surgical field was created. The basilic vein was accessed under direct ultrasound guidance without difficulty with a micropuncture needle and permanent image was recorded. 0.018 wire was then placed into the superior vena cava. Peel-away sheath was placed over the wire. A single lumen peripherally inserted central venous catheter was then placed over the wire and the wire and peel-away sheath were removed. The catheter tip was placed into the superior vena cava and was secured at the skin at 40 cm with a sterile dressing. The catheter withdrew blood well and flushed easily with heparinized saline. The patient tolerated procedure well.    ____________________________ Renford DillsGregory G. Schnier, MD ggs:dp D: 01/26/2013 09:43:34 ET T: 01/26/2013 10:00:05 ET JOB#: [id number]  cc: Renford DillsGregory G. Schnier, MD, <Dictator> Renford DillsGREGORY G SCHNIER MD ELECTRONICALLY SIGNED 02/02/2013 17:56

## 2014-08-12 NOTE — Consult Note (Signed)
Brief Consult Note: Diagnosis: Abdominal pain with enteritis, possible small bowel mass, fever, leukocytosis, constipation, fecal occult neg.   Patient was seen by consultant.   Comments: DRE with soft light brown fecal impaction, distended abd with mild peri umbilical tenderness. Negative rigidity, rebound, guarding. CT is concerning for mechanical obstruction possible etiology small bowel mass, new ileitis Crohn's, foreign body- such as fish or chicken bone with small bowel perforation, small bowel diverticulitis. PLAN: Case d/w Dr. Vira Agar and he recommends  Surgical consult and I will ask pt whom he would like to see. He gets his gi care at [facility]. Patient states he will see anyone avalable here and if he needed surgery, he would prefer to have it done here. No role for luminal evaluation at this time. Pt had his last colonsocopy about 2 yrs ago for f/u polyps. Treat fecal impaction/constipation  with fleets enema today and avoid oral laxatives and keep him NPO.  Obtain esr, crp, cea. Pt is on cipro/flagyl already. Further gi recommendations pending findings..  Electronic Signatures: Gershon Mussel (NP)  (Signed 02-Oct-14 13:26)  Authored: Brief Consult Note   Last Updated: 02-Oct-14 13:26 by Gershon Mussel (NP)

## 2014-08-12 NOTE — Discharge Summary (Signed)
PATIENT NAME:  Edwin Reed, Edwin Reed MR#:  [id number] DATE OF BIRTH:  11/29/1955  DATE OF ADMISSION:  01/20/2013 DATE OF DISCHARGE:  02/06/2013  DISCHARGE DIAGNOSES:  1.  Mesenteric abscess status post exploratory laparotomy, small bowel resection and drainage of abscess.  2.  Postoperative enterotomy requiring reoperation, primary closure.  3.  Diabetes mellitus type 2.  4.  Hypertension.  5.  Hyperlipidemia.  6.  Personal history of colon polyps.   INDICATION FOR ADMISSION: [name] is a pleasant 59 year old who presents with worsening abdominal pain, which began approximately 6 months ago. He was found on CT scan to have a large intermesenteric abscess. He was admitted for IV antibiotics and possible surgical management.   DISCHARGE MEDICATIONS:  1.  Novolin 70/30 40 units subcu q.a.m. and 30 units at bedtime. 2.  Enalapril 20 p.o. b.i.d.  3.  Norvasc 10 mg p.o. daily.  4.  Aspirin 81 p.o. daily.  5.  Atorvastatin 80 p.o. at bedtime.  6.  Viagra 100 mg p.o. daily p.r.n.  7.  Metoprolol 75 mg p.o. b.i.d.  8.  Metformin 1000 p.o. b.i.d.   9.  Hydrochlorothiazide 25 p.o. daily. 10.  Flonase 50 mcg per inhaler, 2 sprays nasal daily. 11.  Percocet 1 tab p.o. q.6 hours p.r.n. pain.   HOSPITAL COURSE: [name] was admitted and made n.p.o. and began on IV antibiotics and pain meds. I had evaluated him and did not believe that this abscess would resolve with antibiotics and he was partially obstructed. Therefore, I brought him to the operating room and performed exploratory laparotomy with small bowel resection and drainage of intermesenteric abscess on 01/21/2013. He slowly improved; however on 01/24/2013 he began having bilious drainage out of his JP. This was quantitated and he was noted to have approximately 600 per day. As his resection was distal and the fluid was bilious, he was re-explored on October 7th and a small enterotomy of his proximal jejunum was found and closed primarily with  Cheree DittoGraham patch. Throughout the remainder of hospital course, he had a severe ileus, which eventually resolved. He was initially given IV antibiotics and made n.p.o. and then his diet was slowly advanced. His ileus resolved and he was taking good p.o. with having regular bowel movements at the time of discharge. His antibiotics were continued throughout hospital course. He also had a flexible sigmoidoscopy, which was performed due to significant amounts of eosinophils and his resected small bowel. There were no obvious findings in the biopsies. He was advanced from IV to p.o. pain meds. His diet improved as well. At the time of discharge, he was taking good p.o. with good p.o. pain control and was voiding and stooling without difficulties.   DISCHARGE INSTRUCTIONS: [name] is to follow up with us as an outpatient. He was given prescriptions for his pain meds. He is to call or return to the ED if has increased pain, nausea, vomiting, redness, drainage from incision.  ____________________________ Si Raiderhristopher A. Willam Munford, MD cal:sw D: 02/14/2013 19:23:00 ET T: 02/15/2013 02:39:50 ET JOB#: [id number]  cc: Cristal Deerhristopher A. Evalin Shawhan, MD, <Dictator> Jarvis NewcomerHRISTOPHER A Kellyn Mccary MD ELECTRONICALLY SIGNED 02/22/2013 18:25

## 2014-08-12 NOTE — Discharge Summary (Signed)
PATIENT NAME:  Edwin PettyURNER, Edwin Reed MR#:  [id number] DATE OF BIRTH:  1956/04/02  DATE OF ADMISSION:  01/20/2013 DATE OF DISCHARGE:  02/06/2013  DISCHARGE DIAGNOSES:  1.  Mesenteric abscess status post exploratory laparotomy, small bowel resection and drainage of abscess.  2.  Postoperative enterotomy requiring reoperation, primary closure.  3.  Diabetes mellitus type 2.  4.  Hypertension.  5.  Hyperlipidemia.  6.  Personal history of colon polyps.   INDICATION FOR ADMISSION: Edwin Reed is a pleasant 59 year old who presents with worsening abdominal pain, which began approximately 6 months ago. He was found on CT scan to have a large intermesenteric abscess. He was admitted for IV antibiotics and possible surgical management.   DISCHARGE MEDICATIONS:  1.  Novolin 70/30 40 units subcu q.a.m. and 30 units at bedtime. 2.  Enalapril 20 p.o. b.i.d.  3.  Norvasc 10 mg p.o. daily.  4.  Aspirin 81 p.o. daily.  5.  Atorvastatin 80 p.o. at bedtime.  6.  Viagra 100 mg p.o. daily p.r.n.  7.  Metoprolol 75 mg p.o. b.i.d.  8.  Metformin 1000 p.o. b.i.d.   9.  Hydrochlorothiazide 25 p.o. daily. 10.  Flonase 50 mcg per inhaler, 2 sprays nasal daily. 11.  Percocet 1 tab p.o. q.6 hours p.r.n. pain.   HOSPITAL COURSE: Edwin Reed was admitted and made n.p.o. and began on IV antibiotics and pain meds. I had evaluated him and did not believe that this abscess would resolve with antibiotics and he was partially obstructed. Therefore, I brought him to the operating room and performed exploratory laparotomy with small bowel resection and drainage of intermesenteric abscess on 01/21/2013. He slowly improved; (Dictation Anomaly)<<MISSING TEXT>>. He began having bilious drainage out of his JP. This was quantitated and he was noted to have approximately 600 per day. As his resection was distal and the fluid was bilious, he was re-explored on October 7th and a small enterotomy of his proximal jejunum was found and closed  primarily with Cheree DittoGraham patch. Throughout the remainder of hospital course, he had a severe ileus, which eventually resolved. He was initially given IV antibiotics and made n.p.o. and then his diet was slowly advanced. His ileus resolved and he was taking good p.o. with having regular bowel movements at the time of discharge. His antibiotics were continued throughout hospital course. He also had a flexible sigmoidoscopy, which was performed due to significant amounts of eosinophils and his resected small bowel. There were no obvious findings in the biopsies. He was advanced from IV to p.o. pain meds. His diet improved as well. At the time of discharge, he was taking good p.o. with good p.o. pain control and was voiding and stooling without difficulties.   DISCHARGE INSTRUCTIONS: Edwin Reed is to follow up with us as an outpatient. He was given prescriptions for his pain meds. He is to call or return to the ED if has increased pain, nausea, vomiting, redness, drainage from incision.  ____________________________ Si Raiderhristopher A. Laniah Grimm, MD cal:sw D: 02/14/2013 19:23:58 ET T: 02/15/2013 02:39:50 ET JOB#: [id number]  cc: Cristal Deerhristopher A. Avantika Shere, MD, <Dictator>

## 2014-08-12 NOTE — Consult Note (Signed)
CC: absess with surgery twice.  He likely has not had eosinophilic gastrointestinal disease due to the absense of diarrhea.  The day before discharge I would like to do a flex sig with fleets enema and do bx of rectum to be sure.  He is agreeable.  He has pain, nausea and vomiting with oral intake,  he may have stress ulcers of GI tract, he was started on oral protonix yesterday, if he has any trouble keeping this down please switch to iv for a few days.    Electronic Signatures: Scot JunElliott, Tynia Wiers T (MD)  (Signed on 13-Oct-14 20:10)  Authored  Last Updated: 13-Oct-14 20:10 by Scot JunElliott, Toshika Parrow T (MD)

## 2014-08-13 NOTE — Consult Note (Signed)
PATIENT NAME:  Edwin Reed, CLACK MR#:  [id number] DATE OF BIRTH:  [birth year]  DATE OF CONSULTATION:  09/28/2013  REFERRING PHYSICIAN:  Loraine Leriche A. Egbert Garibaldi, MD CONSULTING PHYSICIAN:  Shaune Pollack, MD  PRIMARY CARE PHYSICIAN: Nonlocal.  REASON FOR CONSULTATION: Medical management for hypertension and diabetes.   REVIEW OF HISTORY: The patient is a 59 year old African-American male with a history of hypertension, diabetes and appendicitis, who was admitted for recurrent appendicitis. Since the patient has hypertension and diabetes, Dr. Egbert Garibaldi requests medical management. The patient has had abdominal pain and nausea, but denies any vomiting or diarrhea. Denies any fever or chills. The patient denies any other symptoms.   PAST MEDICAL HISTORY: Hypertension, diabetes, appendicitis.   PAST SURGICAL HISTORY: Appendectomy and bowel resection.   ALLERGIES: No.   HOME MEDICATIONS: 1. Viagra 100 mg p.o. once a day p.r.n.  2. Novolin 70/30 at 40 subcutaneous in the morning and 37 subcutaneous in the evening. 3. Lopressor 50 mg 1.5 tablets p.o. b.i.d. 4. Metformin 1000 mg p.o. b.i.d. 5. Hydrochlorothiazide 25 mg p.o. once a day. 6. Flonase 50 mcg spray 2 sprays nasal once a day. 7. Vasotec 20 mg p.o. b.i.d. 8. Atorvastatin 80 mg p.o. at bedtime.  9. Aspirin 81 mg p.o. daily.  10. Norvasc 10 mg p.o. daily.  11. Acetaminophen/oxycodone 325/5 mg p.o. tablets 1 tab every 6 hours p.r.n.   FAMILY HISTORY: Diabetes in his mother and brother. His brother also had a heart attack in 72s.   REVIEW OF SYSTEMS:  CONSTITUTIONAL: The patient denies any fever or chills. No headache or dizziness. No weakness.  EYES: No double vision or blurred vision.  ENT: No postnasal drip, slurred speech or dysphagia.  CARDIOVASCULAR: No chest pain, palpitation, orthopnea, nocturnal dyspnea. No leg edema.  PULMONARY: No cough, sputum, shortness of breath or hemoptysis.  GASTROINTESTINAL: Positive for abdominal pain and nausea, but no  vomiting or diarrhea. No melena or bloody stool.  GENITOURINARY: No dysuria, hematuria or incontinence.  SKIN: No rash or jaundice.  NEUROLOGIC: No syncope, loss of consciousness or seizure.  HEMATOLOGY: No easy bruising or bleeding.  ENDOCRINOLOGY: No polyuria, polydipsia, heat or cold intolerance.  SKIN: No rash or jaundice.   PHYSICAL EXAMINATION:  VITAL SIGNS: Temperature 99.7, blood pressure 153/80, pulse 89, oxygen saturation 90% on room air.  GENERAL: The patient is alert, awake, oriented, in no acute distress.  HEENT: Pupils round and equally reactive to light and accommodation. Moist oral mucosa. Clear oropharynx.  NECK: Supple. No JVD or carotid bruit. No lymphadenopathy. No bruits. No thyromegaly.  CARDIOVASCULAR: S1, S2, regular rate and rhythm. No murmurs or gallops.  PULMONARY: Bilateral air entry. No wheezing or rales. No use of accessory muscles to breathe.  ABDOMEN: Soft. No distention, but has tenderness in the middle part in the right lower quadrant. No rigidity. No rebound. Bowel sounds present. No obvious organomegaly.  EXTREMITIES: No edema, clubbing or cyanosis. No calf tenderness. Bilateral pedal pulses present.  SKIN: No rash or jaundice.  NEUROLOGY: A and O x3. No focal deficit. Power 5 out of 5. Sensation intact.   LABORATORY DATA: WBC 9.0, hemoglobin 11.4, platelets 233. Glucose 230, BUN 18, creatinine 1.67, sodium 135, potassium 4.0, chloride 102, bicarbonate 26. Blood culture is negative so far. CAT scan of abdomen and pelvis showed inflammatory changes within mesentery as well as free fluid above the liver, worrisome for acute appendicitis. Urinalysis is negative.   IMPRESSIONS:  1. Recurrent appendicitis.  2. Hypertension.  3. Diabetes.  4.  Acute renal failure.   RECOMMENDATION:  1. Agree with the current treatment.  2. For acute renal failure, continue IV fluid support. Follow up BMP.  3. For diabetes, continue sliding scale. Since the patient's blood  sugar is elevated, I will add Levemir 10 units subcutaneous at bedtime. 4. Hypertension. Continue Lopressor and Vasotec and may need hydralazine p.r.n., and adjust the hypertension medication dose.  5. For recurrent appendicitis, will defer to Dr. Egbert GaribaldiBird for further decision. Now, continue antibiotics.   I discussed the patient's condition and recommendations with the patient and the patient's wife.   TIME SPENT: About 53 minutes.   ____________________________ Shaune PollackQing Latorria Zeoli, MD qc:lb D: 09/28/2013 11:17:02 ET T: 09/28/2013 11:39:55 ET JOB#: [id number]  cc: Shaune PollackQing Efrain Clauson, MD, <Dictator> Shaune PollackQING Bryer Cozzolino MD ELECTRONICALLY SIGNED 09/29/2013 15:07

## 2014-08-13 NOTE — Discharge Summary (Signed)
PATIENT NAME:  Edwin Reed, Edwin Reed MR#:  [id number] DATE OF BIRTH:  1955/11/17  DATE OF ADMISSION:  09/27/2013 DATE OF DISCHARGE:  10/03/2013  FINAL DIAGNOSES:  Intra-abdominal abscess, presumed recurrent. Complicated acute appendicitis.   PRINCIPLE PROCEDURES:   1.  CT scan of the abdomen and pelvis x 2.  2.  Intravenous antibiotics.  3.  Internal medicine consultation.   HOSPITAL COURSE SUMMARY:  The patient was admitted with recurrent abdominal pain. CT scan findings confirmed an intra-abdominal abscess similar to his previous presentation back in October of last year. Due to his complicated postsurgical history in the past related to this with significant morbidity at that time, I elected, in conjunction with consultation with my associate, Dr. Excell Seltzerooper, who also saw the patient during the hospitalization, to treat him with intravenous antibiotics and bowel rest. This was able to be successfully accomplished. Blood sugars were marginally controlled and, as such, internal medicine did become involved in his care. The patient had good response on intravenous antibiotics with improving findings on his CT scan. He was therefore discharged home on 14th of June with a total of 7 more days with oral antibiotics. I will follow him up in a week's time with repeat CT scan at that time. Remaining medications can be found on the reconciliation form.    ____________________________ Redge GainerMark A. Egbert GaribaldiBird, MD mab:dmm D: 10/18/2013 09:52:00 ET T: 10/18/2013 10:49:48 ET JOB#: [id number]  MARK A BIRD MD ELECTRONICALLY SIGNED 10/18/2013 14:21

## 2014-08-13 NOTE — H&P (Signed)
Subjective/Chief Complaint abdominal pain   History of Present Illness 59 y/o male with previous history of retroperitoneal abscess thought secondary to perforated appendicitis s/p ex lap and small bowel resection and drainage of abscess in Oct 2014,  Post op course complicated by small bowel leak needing re-operation with repair.  Appendix never visuailized at either operation.  Did well with no abdominal pain until last night when started having diffuse abdominal pain similar to when he came into hospital last year.   Past History obesity Type I DM HTN   Past Medical Health Hypertension, Diabetes Mellitus   Past Med/Surgical Hx:  Diabetes:   HTN:   Bowel Resection:   Appendectomy:   ALLERGIES:  No Known Allergies:   HOME MEDICATIONS: Medication Instructions Status  acetaminophen-oxyCODONE 325 mg-5 mg oral tablet 1 tab(s) orally every 6 hours, As Needed, pain , As needed, pain Active  NovoLIN 70/30 human recombinant 70 units-30 units/mL subcutaneous suspension 40 unit(s) subcutaneous in the morning and 30 units in the evening  Active  enalapril 20 mg oral tablet 1 tab(s) orally 2 times a day Active  amLODIPine 10 mg oral tablet 1 tab(s) orally once a day Active  aspirin 81 mg oral tablet 1 tab(s) orally once a day Active  atorvastatin 80 mg oral tablet 1 tab(s) orally once a day (at bedtime) Active  Viagra 100 mg oral tablet 1 tab(s) orally once a day, As Needed Active  metoprolol 50 mg oral tablet 1.5 tab(s) orally 2 times a day Active  metFORMIN 1000 mg oral tablet 1 tab(s) orally 2 times a day Active  hydrochlorothiazide 25 mg oral tablet 1 tab(s) orally once a day Active  Flonase 50 mcg/inh nasal spray 2 spray(s) nasal once a day Active   Family and Social History:  Family History Non-Contributory   Social History negative tobacco, negative ETOH   Place of Living Home   Review of Systems:  Subjective/Chief Complaint see above.   Abdominal Pain Yes   Tolerating  Diet No   Medications/Allergies Reviewed Medications/Allergies reviewed   Physical Exam:  GEN no acute distress, obese, disheveled, temp 98.6 p75 bp 148/80   HEENT pale conjunctivae   NECK supple   RESP normal resp effort  clear BS   CARD regular rate   ABD positive tenderness  soft  midline scar, small peri-umbilical hernia, tender in RLQ and mid abdomen,   LYMPH negative neck   EXTR negative cyanosis/clubbing, negative edema   SKIN normal to palpation, No rashes   NEURO cranial nerves intact, follows commands   PSYCH A+O to time, place, person, good insight   Lab Results: Hepatic:  08-Jun-15 10:52   Bilirubin, Total 1.0  Alkaline Phosphatase 64 (45-117 NOTE: New Reference Range 03/12/13)  SGPT (ALT) 15  SGOT (AST)  11  Total Protein, Serum 6.8  Albumin, Serum  3.3  Routine Chem:  08-Jun-15 10:52   Glucose, Serum  276  BUN 16  Creatinine (comp)  1.35  Sodium, Serum 136  Potassium, Serum 4.0  Chloride, Serum 102  CO2, Serum 24  Calcium (Total), Serum 8.6  Osmolality (calc) 283  eGFR (African American) >60  eGFR (Non-African American)  58 (eGFR values <59m/min/1.73 m2 may be an indication of chronic kidney disease (CKD). Calculated eGFR is useful in patients with stable renal function. The eGFR calculation will not be reliable in acutely ill patients when serum creatinine is changing rapidly. It is not useful in  patients on dialysis. The eGFR calculation may not  be applicable to patients at the low and high extremes of body sizes, pregnant women, and vegetarians.)  Anion Gap 10  Lipase 81 (Result(s) reported on 27 Sep 2013 at 11:51AM.)  Cardiac:  08-Jun-15 10:52   Troponin I < 0.02 (0.00-0.05 0.05 ng/mL or less: NEGATIVE  Repeat testing in 3-6 hrs  if clinically indicated. >0.05 ng/mL: POTENTIAL  MYOCARDIAL INJURY. Repeat  testing in 3-6 hrs if  clinically indicated. NOTE: An increase or decrease  of 30% or more on serial  testing suggests a   clinically important change)  Routine UA:  08-Jun-15 13:25   Color (UA) Amber  Clarity (UA) Hazy  Glucose (UA) >=500  Bilirubin (UA) Negative  Ketones (UA) Trace  Specific Gravity (UA) 1.026  Blood (UA) Negative  pH (UA) 5.0  Protein (UA) 30 mg/dL  Nitrite (UA) Negative  Leukocyte Esterase (UA) Negative (Result(s) reported on 27 Sep 2013 at 02:56PM.)  RBC (UA) 2 /HPF  WBC (UA) 4 /HPF  Bacteria (UA) NONE SEEN  Epithelial Cells (UA) 1 /HPF  Mucous (UA) PRESENT  Hyaline Cast (UA) 6 /LPF (Result(s) reported on 27 Sep 2013 at 02:56PM.)  Routine Hem:  08-Jun-15 10:52   WBC (CBC) 4.3  RBC (CBC) 4.45  Hemoglobin (CBC)  12.6  Hematocrit (CBC) 40.1  Platelet Count (CBC) 261  MCV 90  MCH 28.3  MCHC  31.5  RDW  15.8  Neutrophil % 75.1  Lymphocyte % 11.9  Monocyte % 11.4  Eosinophil % 1.1  Basophil % 0.5  Neutrophil # 3.2  Lymphocyte #  0.5  Monocyte # 0.5  Eosinophil # 0.0  Basophil # 0.0 (Result(s) reported on 27 Sep 2013 at 11:08AM.)   Radiology Results: LabUnknown:    08-Jun-15 16:34, CT Abdomen and Pelvis With Contrast  PACS Image  CT:  CT Abdomen and Pelvis With Contrast  REASON FOR EXAM:    (1) diffuse abd pain, hx of bowel surgery; (2)   diffuse abd pain, hx of bowl surg  COMMENTS:       PROCEDURE: CT  - CT ABDOMEN / PELVIS  W  - Sep 27 2013  4:34PM     CLINICAL DATA:  Generalized abdominal pain    EXAM:  CT ABDOMENAND PELVIS WITH CONTRAST    TECHNIQUE:  Multidetector CT imaging of the abdomen and pelvis was performed  using the standard protocol following bolus administration of  intravenous contrast.  CONTRAST:  125 cc Isovue 370    COMPARISON:  01/24/2013    FINDINGS:  Bibasilar volume loss at the lung bases.    Minimal aortic valve and coronary artery calcifications.    Diffuse hepatic steatosis.    There is free fluid about the liver and within the mesenteries.  There is stranding within the omental fat as well as mesenteric fat.  There is  no obvious focal bowel wall thickening and there is no  obvious free intraperitoneal gas. There is free fluid layering in  the pelvis.    The fluid and fatty stranding is centered the deep in the  mesenteric. There is a tubular enhancing structure with central low  density on image 57 leading to the cecum worrisome for and appendix  with acute appendicitis. Apparently, the patient given a history of  appendectomy.    Spleen, kidneys, pancreas, adrenal glands,and gallbladder are  within normal limits.     IMPRESSION:  There are inflammatory changes within the mesenteric as well as free  fluid about the liver, in the  abdomen, and in the pelvis. Findings  are most consistent with an inflammatory process. Despite the fact  that the given history was appendectomy, findings above are  worrisome for acute appendicitis.    Stranding in fluid in the abdomen can also be result of peritoneal  carcinomatosis. Correlate clinically as for the possibility of  malignancy.      Electronically Signed    By: Maryclare Bean M.D.    On: 09/27/2013 17:05         Verified By: Jamas Lav, M.D.,    Assessment/Admission Diagnosis 59 y/o male with presumed recurrent appendicitis.   Plan admit, IV abx, appendectomy,   Discussed with him in detail that he represents the 5 % of patients who will present with recurrent appendicitis following drainage of peri-appendiceal abscesses.   Electronic Signatures: Sherri Rad (MD)  (Signed 08-Jun-15 18:21)  Authored: CHIEF COMPLAINT and HISTORY, PAST MEDICAL/SURGIAL HISTORY, ALLERGIES, HOME MEDICATIONS, FAMILY AND SOCIAL HISTORY, REVIEW OF SYSTEMS, PHYSICAL EXAM, LABS, Radiology, ASSESSMENT AND PLAN   Last Updated: 08-Jun-15 18:21 by Sherri Rad (MD)

## 2014-11-02 IMAGING — CR DG ABDOMEN 1V
1 series · 2 of 2 positions shown · non-contrast
Comparison: none

REASON FOR EXAM: r/o foreign body-diabetic SQ needle tip broke in lower
abdomen 2-3 weeks ago  pe
COMMENTS:

[Series 1: ap · 0.17mm/px · 2 of 2 slices shown]
[im 1/2]
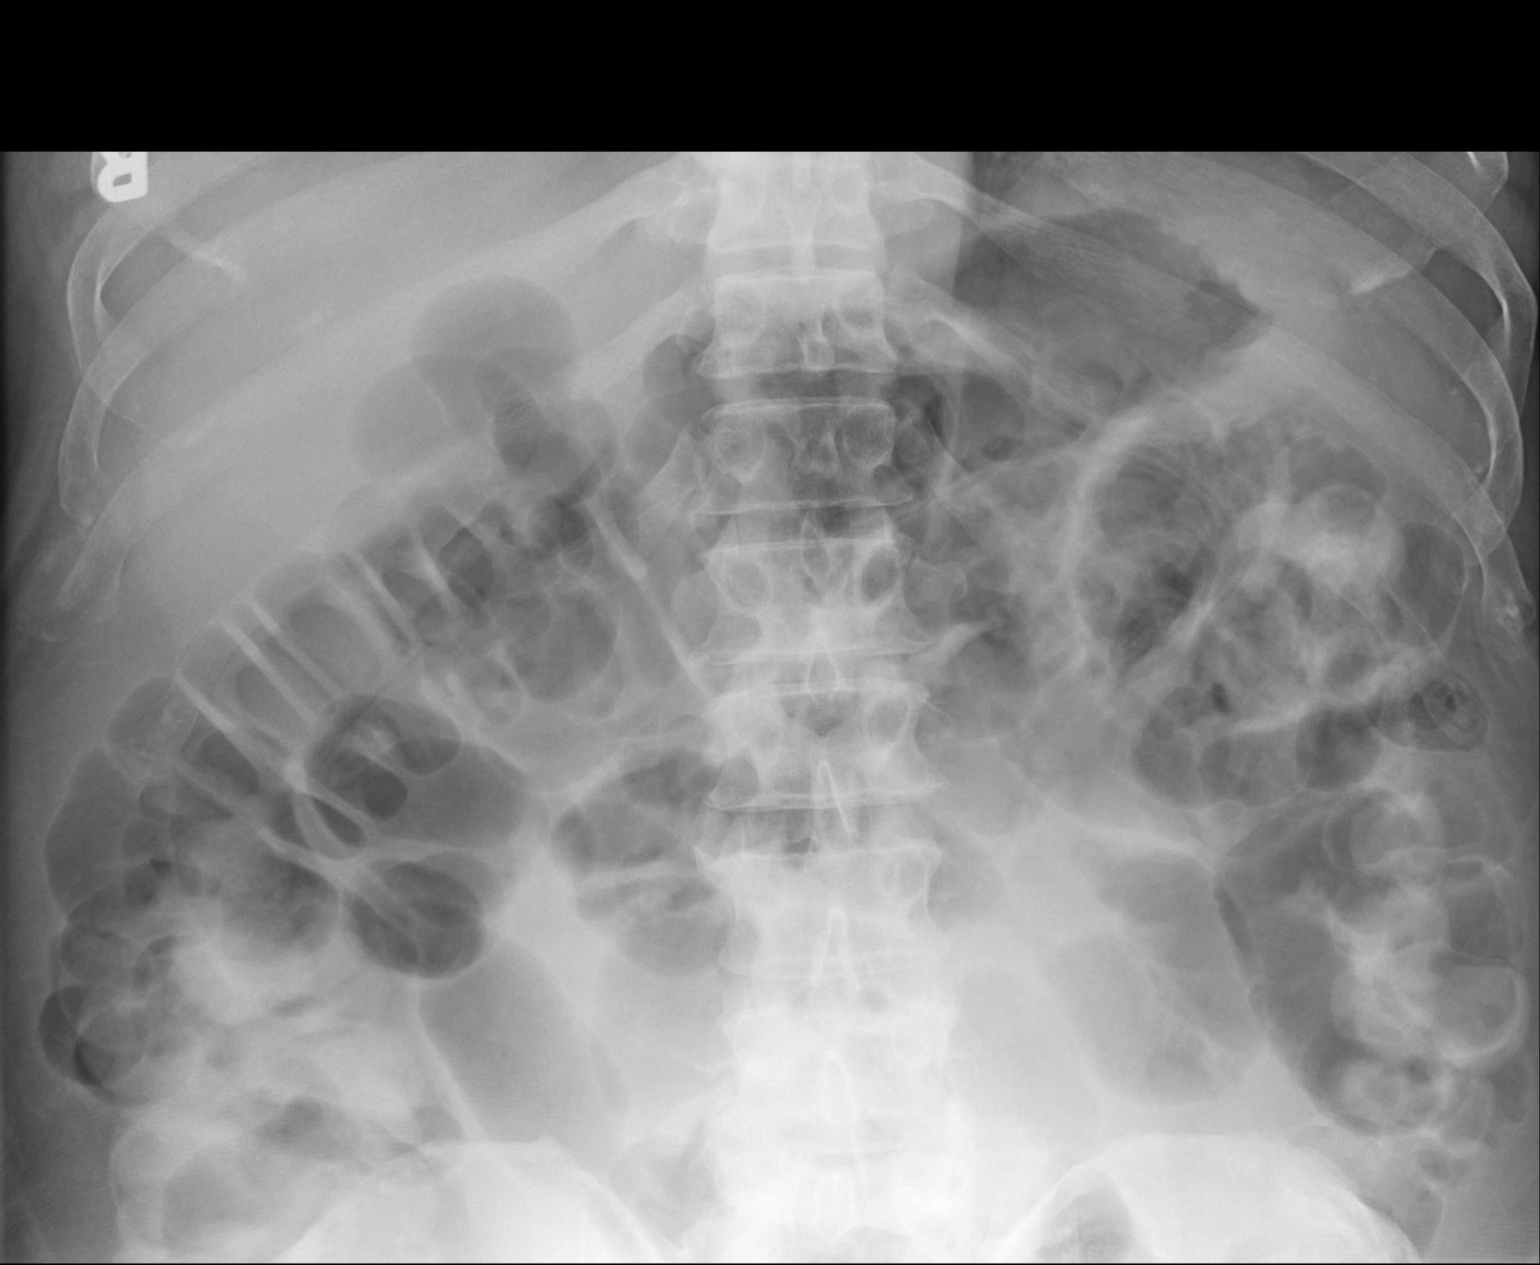
[im 2/2]
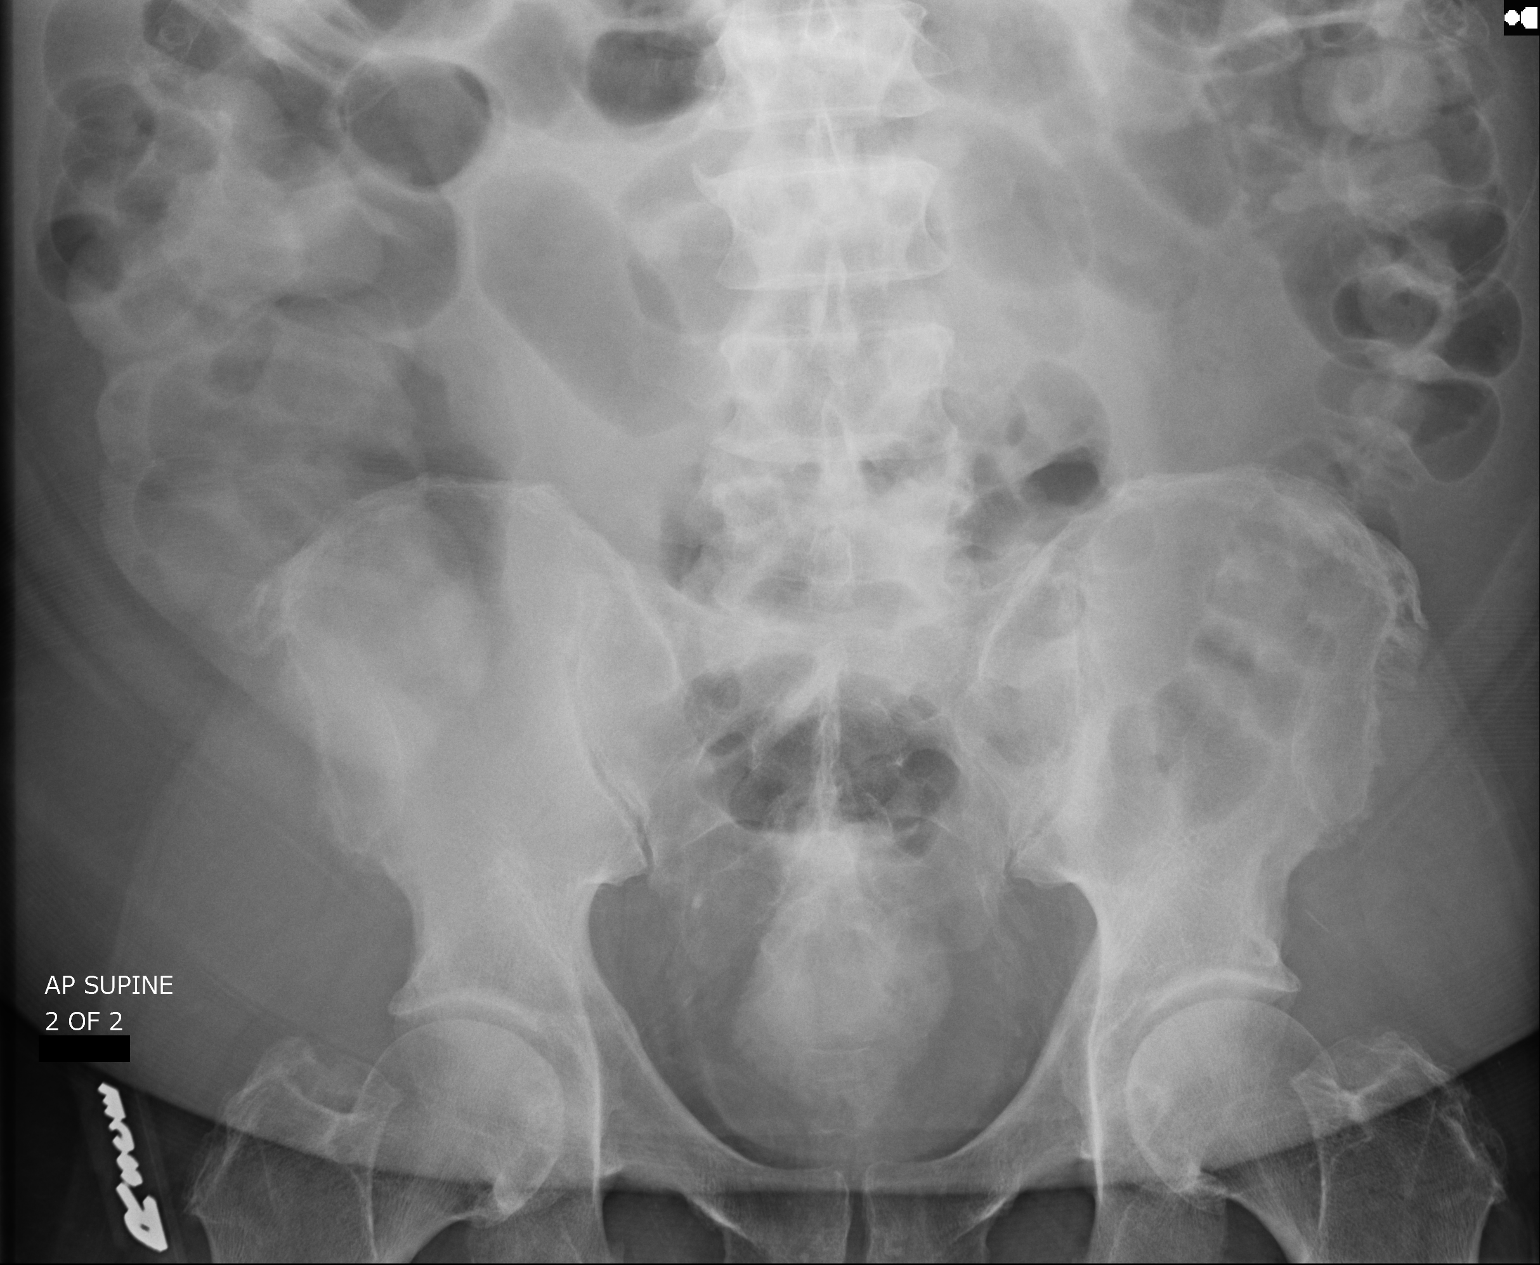

[2 of 2 positions shown; findings below may reference images not displayed]

PROCEDURE:     DXR - DXR ABDOMEN AP ONLY  - January 22, 2013  [DATE]

RESULT:     Two supine images performed in the AP projection over the
abdomen and pelvis reveal no evidence of retained metallic foreign bodies.
The site of suspected needle breakage is not noted other than in the lower
abdomen. The bowel gas pattern is relatively nonspecific with there being
moderate amounts of small and large bowel gas and a larger moderate amount
of stool present in the colon. Contrast from a CT scan from January 20, 2013
persists.
IMPRESSION: No retained metallic needle like foreign body is
demonstrated.

[REDACTED]

## 2014-11-10 IMAGING — CR DG ABDOMEN 2V
1 series · 4 of 4 positions shown · non-contrast
Comparison: none

REASON FOR EXAM: ileus post op.
COMMENTS:

PROCEDURE:     DXR - DXR ABDOMEN 2 V FLAT AND ERECT  - January 30, 2013  [DATE]
RESULT:     Comparisons:   01/22/2013

[Series 1: w abdomen upright · 0.14mm/px · 4 of 4 slices shown]
[im 1/4]
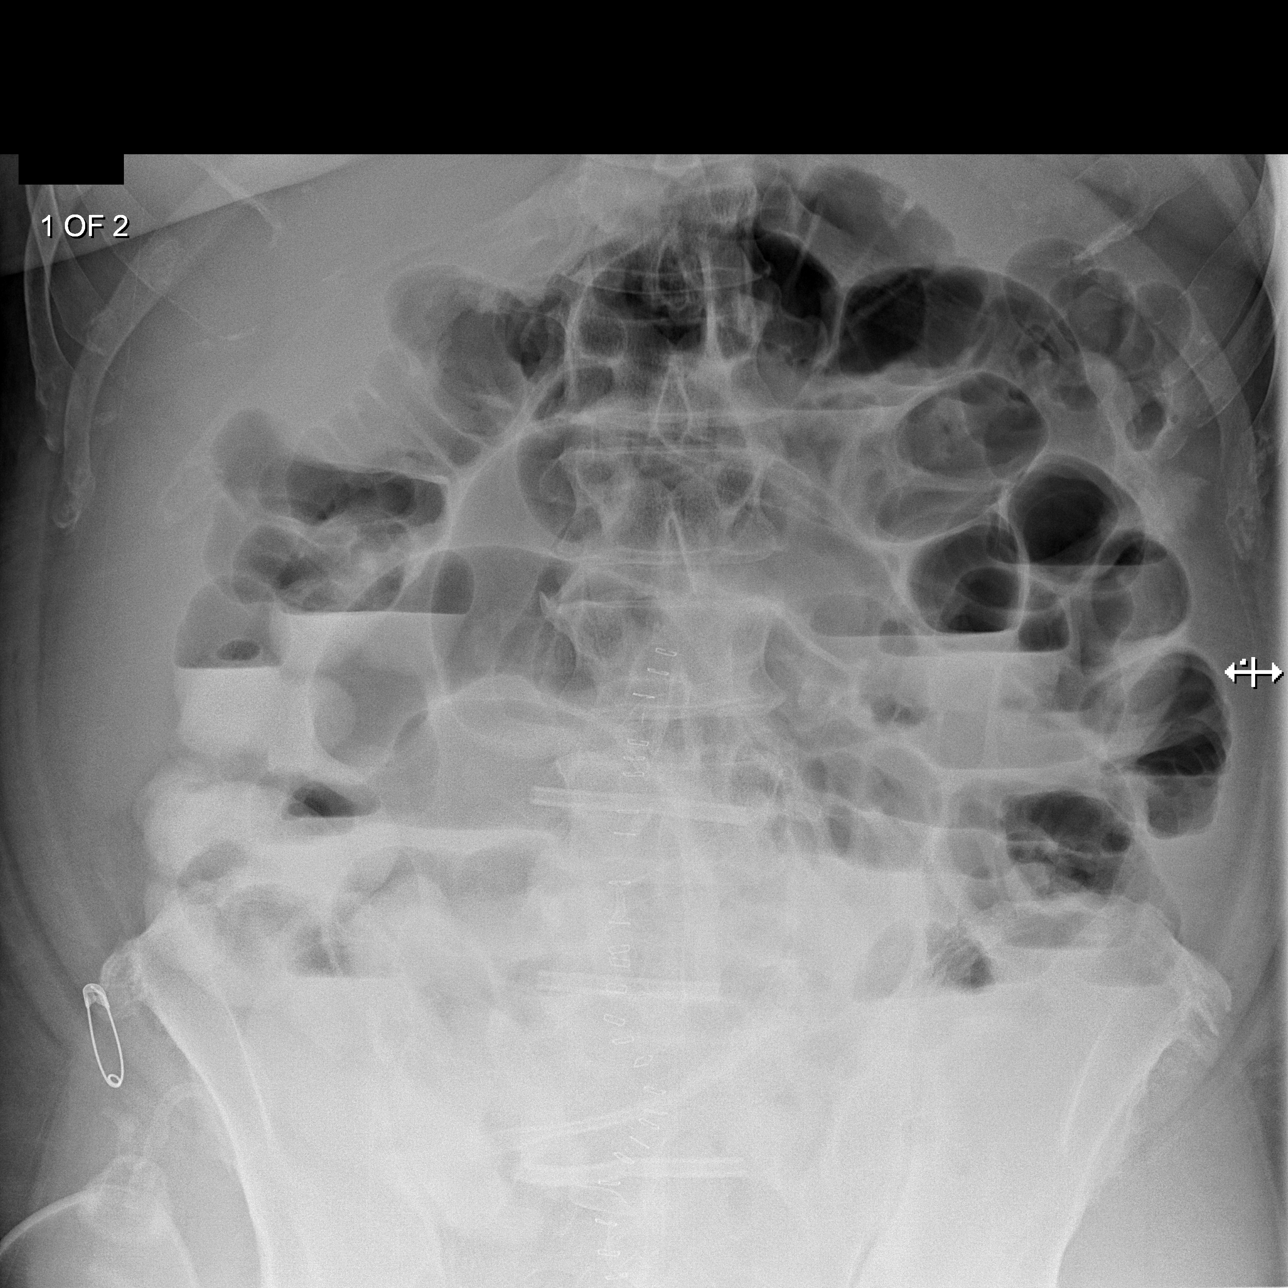
[im 2/4]
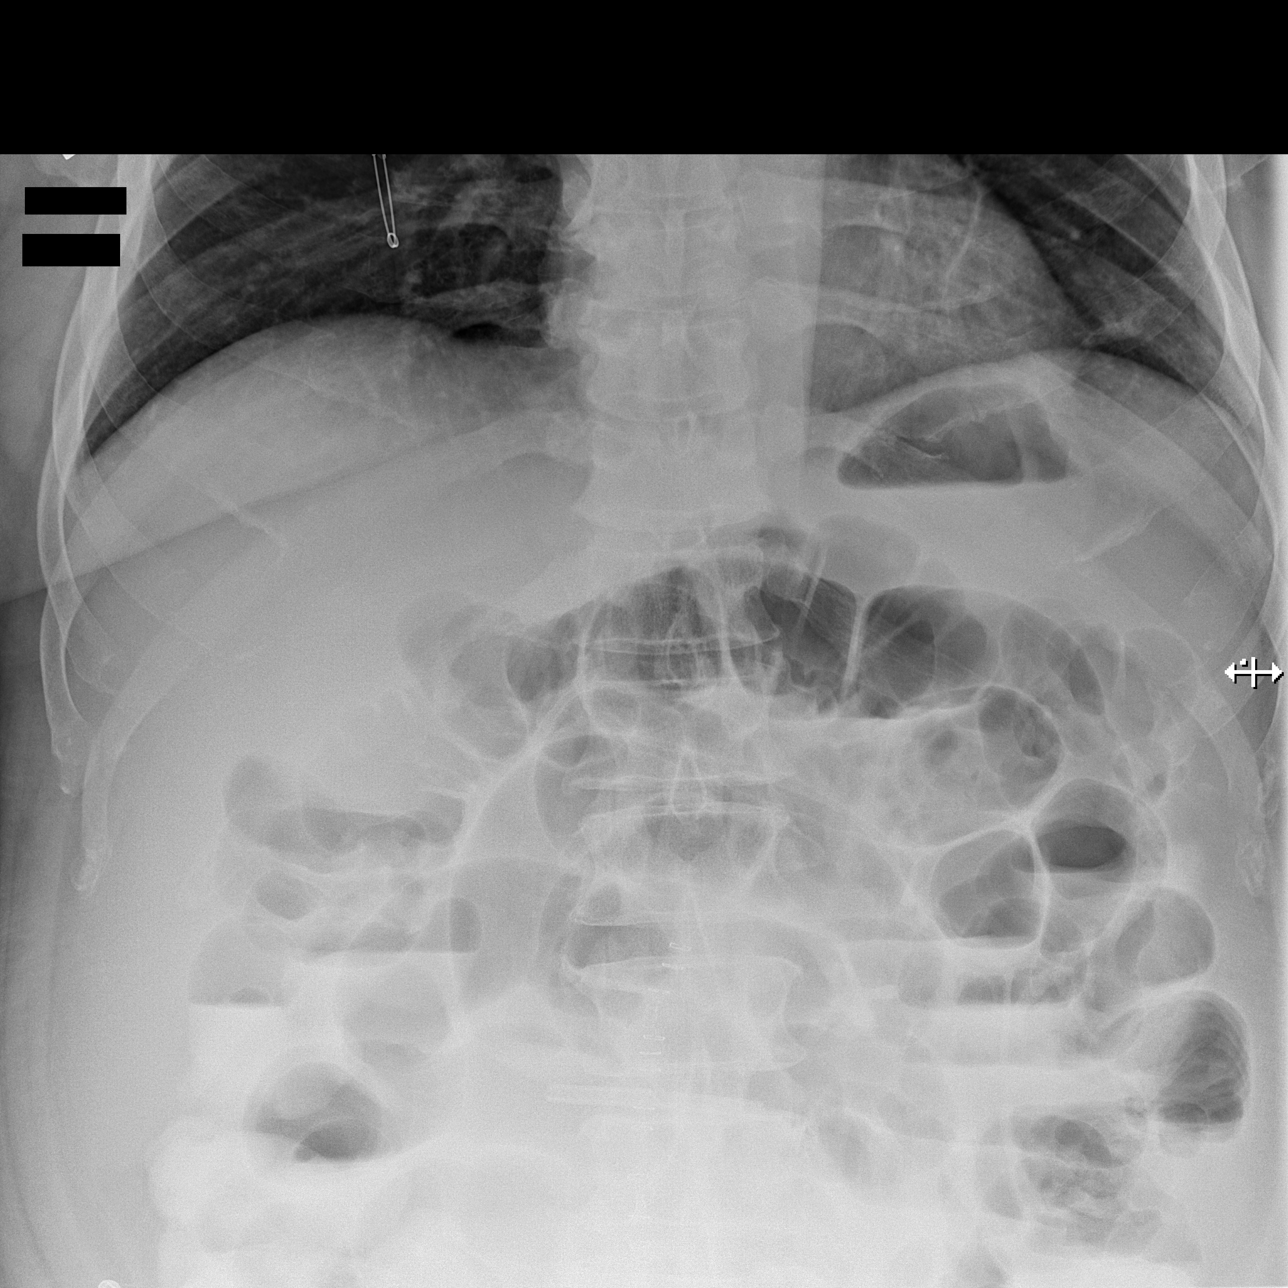
[im 3/4]
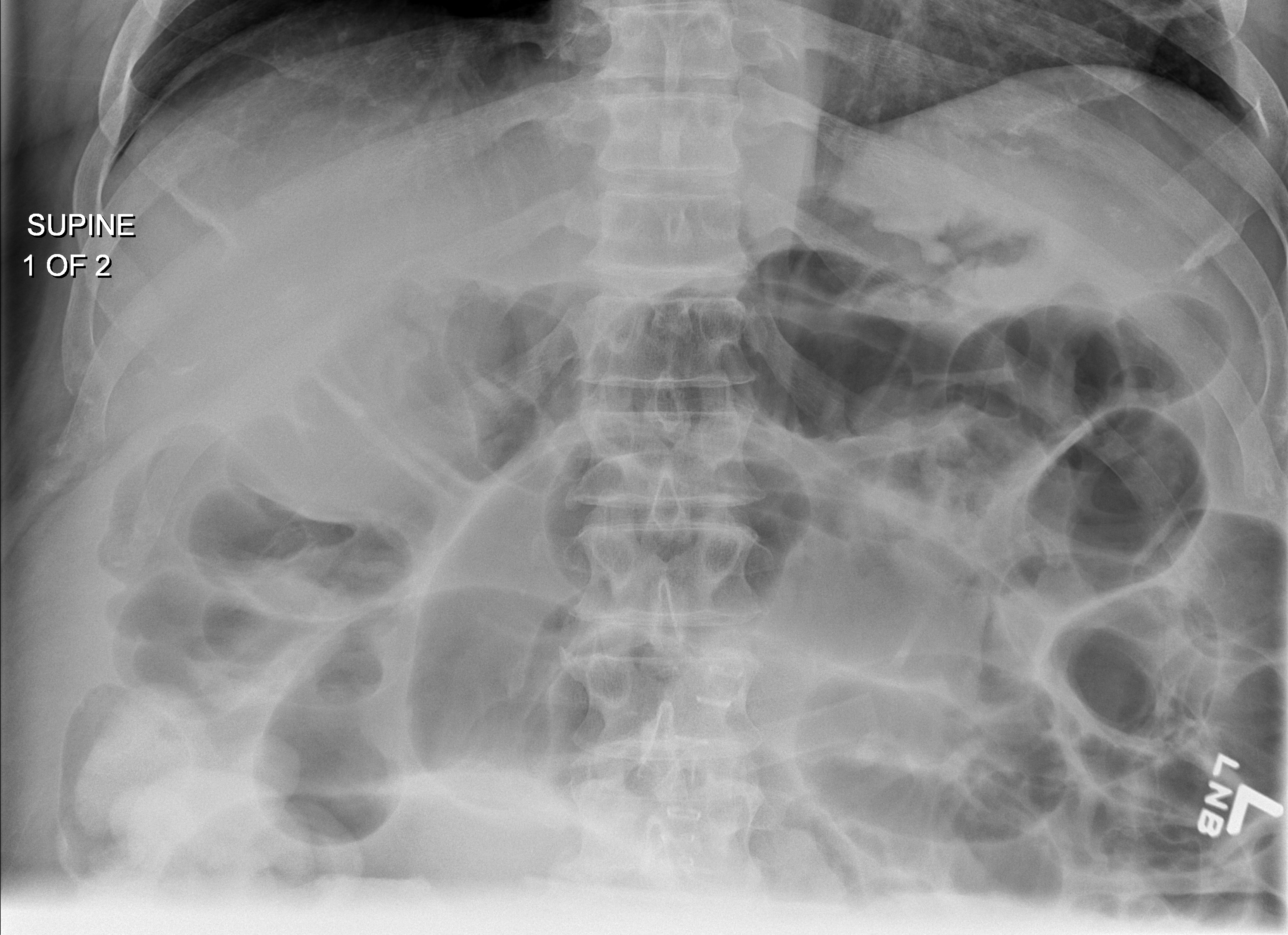
[im 4/4]
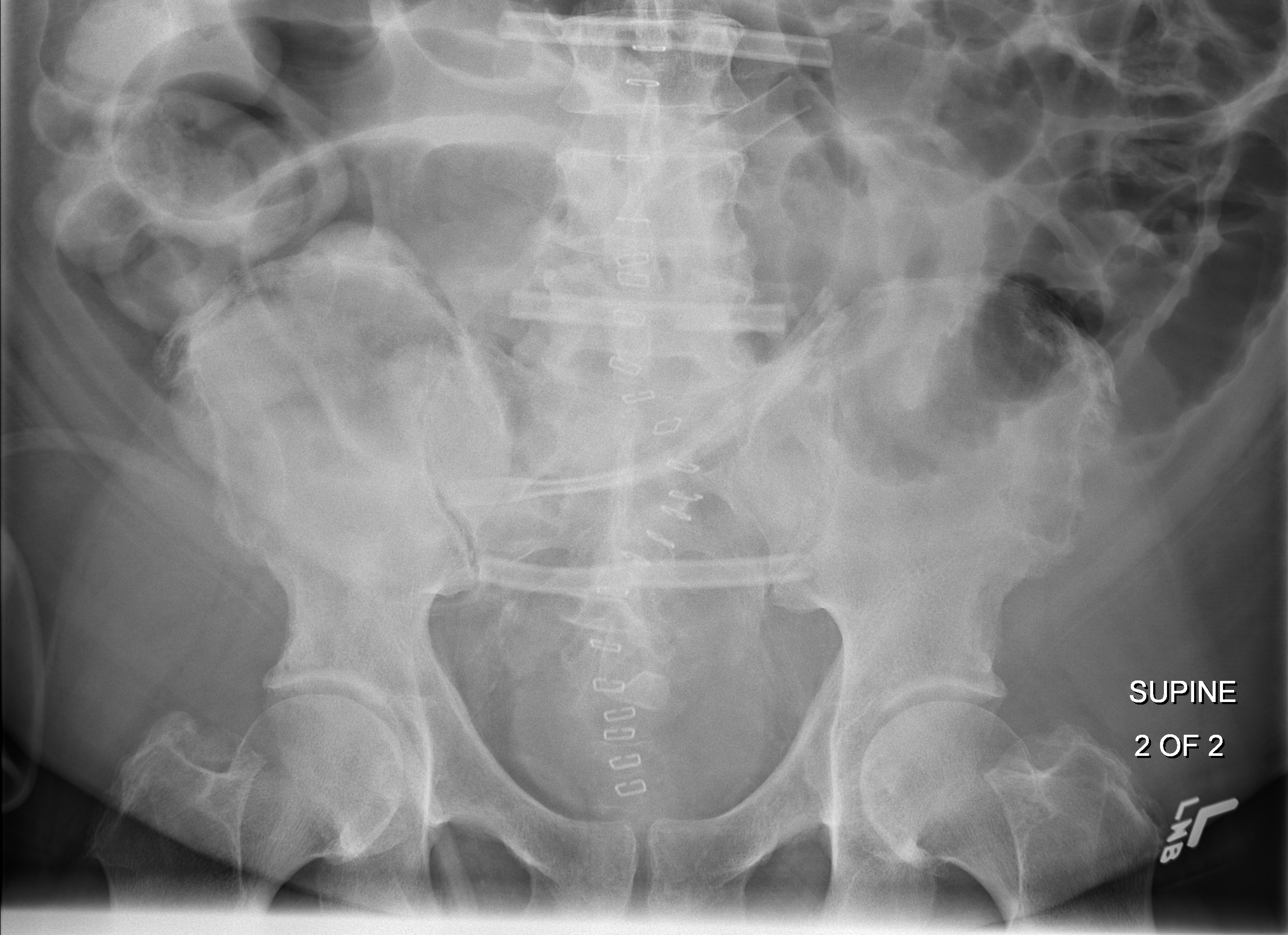

[4 of 4 positions shown; findings below may reference images not displayed]

FINDINGS: Supine and upright views of the abdomen are provided.

There are multiple distended loops of small bowel and colon with multiple
air-fluid levels concerning for ileus versus partial small bowel
obstruction. There is no pathologic calcification along the expected course
of the ureters. There is no evidence of pneumoperitoneum, portal venous gas,
or pneumatosis.

The osseous structures are unremarkable.
IMPRESSION: Please see above.

[REDACTED]

## 2015-01-28 ENCOUNTER — Encounter: Payer: Self-pay | Admitting: Emergency Medicine

## 2015-01-28 ENCOUNTER — Ambulatory Visit
Admission: EM | Admit: 2015-01-28 | Discharge: 2015-01-28 | Disposition: A | Discharge status: Home / Self Care | Payer: 59 | Attending: Family Medicine | Admitting: Family Medicine

## 2015-01-28 DIAGNOSIS — L723 Sebaceous cyst: Secondary | ICD-10-CM

## 2015-01-28 DIAGNOSIS — L02212 Cutaneous abscess of back [any part, except buttock]: Secondary | ICD-10-CM | POA: Diagnosis not present

## 2015-01-28 DIAGNOSIS — L089 Local infection of the skin and subcutaneous tissue, unspecified: Secondary | ICD-10-CM

## 2015-01-28 HISTORY — DX: Type 2 diabetes mellitus without complications: E11.9

## 2015-01-28 MED ORDER — MUPIROCIN 2 % EX OINT: 1.0000 "application " | TOPICAL_OINTMENT | Freq: Three times a day (TID) | CUTANEOUS | Status: DC

## 2015-01-28 MED ORDER — SULFAMETHOXAZOLE-TRIMETHOPRIM 800-160 MG PO TABS: 1.0000 | ORAL_TABLET | Freq: Two times a day (BID) | ORAL | Status: AC

## 2015-01-28 NOTE — ED Provider Notes (Signed)
CSN: [id number]     Arrival date & time 01/28/15  1048 History   First MD Initiated Contact with Patient 01/28/15 1108      Nurses notes were reviewed. Chief Complaint  Patient presents with  . Cyst    According to the patient and his wife he's had this abscess has been draining for about 2 weeks. His wife has been squeezing pus out of the abscess but she is going time of doing that. Also said a foul odor. (Consider location/radiation/quality/duration/timing/severity/associated sxs/prior Treatment) Patient is a 59 y.o. male presenting with abscess. The history is provided by the patient and the spouse. No language interpreter was used.  Abscess Location:  Shoulder/arm and torso Shoulder/arm abscess location:  R shoulder Torso abscess location:  Upper back Abscess quality: draining   Red streaking: no   Duration:  2 weeks Progression:  Unable to specify Chronicity:  New Context: diabetes   Context: not injected drug use and not skin injury   Relieved by:  Draining/squeezing Ineffective treatments:  Draining/squeezing Associated symptoms: no anorexia, no fatigue, no fever, no headaches, no nausea and no vomiting   Risk factors: family hx of MRSA     Past Medical History  Diagnosis Date  . Diabetes mellitus without complication (HCC)    History reviewed. No pertinent past surgical history. History reviewed. No pertinent family history. Social History  Substance Use Topics  . Smoking status: Never Smoker   . Smokeless tobacco: None  . Alcohol Use: No    patient does not smoke. Review of Systems  Constitutional: Negative for fever and fatigue.  Gastrointestinal: Negative for nausea, vomiting and anorexia.  Neurological: Negative for headaches.  All other systems reviewed and are negative.   Allergies  Review of patient's allergies indicates no known allergies.  Home Medications   Prior to Admission medications   Medication Sig Start Date End Date Taking? Authorizing  Provider  insulin aspart protamine- aspart (NOVOLOG MIX 70/30) (70-30) 100 UNIT/ML injection Inject into the skin.   Yes Historical Provider, MD  mupirocin ointment (BACTROBAN) 2 % Apply 1 application topically 3 (three) times daily. 01/28/15   Hassan Rowan, MD  sulfamethoxazole-trimethoprim (BACTRIM DS,SEPTRA DS) 800-160 MG tablet Take 1 tablet by mouth 2 (two) times daily. 01/28/15 02/04/15  Hassan Rowan, MD   Meds Ordered and Administered this Visit  Medications - No data to display  BP 149/67 mmHg  Pulse 81  Temp(Src) 98 F (36.7 C) (Tympanic)  Resp 20  Ht  (1.803 m)  Wt 205 lb (92.987 kg)  BMI 28.60 kg/m2  SpO2 100% No data found.   Physical Exam  Constitutional: He is oriented to person, place, and time. He appears well-developed and well-nourished.  HENT:  Head: Normocephalic and atraumatic.  Eyes: Pupils are equal, round, and reactive to light.  Neck: Neck supple.  Musculoskeletal: Normal range of motion.  Neurological: He is alert and oriented to person, place, and time.  Skin: Skin is warm. Rash noted. He is diaphoretic.     On the upper right back/shoulder there is a abscess that is draining. The small is mild pus could be elicited from the opening. The abscess is not fluctuant enough for it to be excised. Posterolisthesis is yellow without odor  Psychiatric: He has a normal mood and affect.  Vitals reviewed.   ED Course  Procedures (including critical care time)  Labs Review Labs Reviewed - No data to display  Imaging Review No results found.   Visual Acuity  Review  Right Eye Distance:   Left Eye Distance:   Bilateral Distance:    Right Eye Near:   Left Eye Near:    Bilateral Near:         MDM   1. Abscess of back   2. Infected sebaceous cyst    Patient has what appears be a sebaceous cyst that has gotten infected. Wife was requesting that I excised a sebaceous cyst which I declined at this time. Explained to her that we need to treat the  infection was the infection was calmed down taking go to PCP and maybe get surgical referral if needed to have the whole sebaceous cyst removed. Turns out that his mother had something similar and this would have to happen to her to keep the infections from recurring. This time we'll place on Bactroban ointment and Septra DS twice a day. Patient and wife declined work note for today.    Hassan Rowan, MD 01/28/15 [phone]

## 2015-01-28 NOTE — Discharge Instructions (Signed)
Abscess °An abscess (boil or furuncle) is an infected area on or under the skin. This area is filled with yellowish-white fluid (pus) and other material (debris). °HOME CARE  °· Only take medicines as told by your doctor. °· If you were given antibiotic medicine, take it as directed. Finish the medicine even if you start to feel better. °· If gauze is used, follow your doctor's directions for changing the gauze. °· To avoid spreading the infection: °¨ Keep your abscess covered with a bandage. °¨ Wash your hands well. °¨ Do not share personal care items, towels, or whirlpools with others. °¨ Avoid skin contact with others. °· Keep your skin and clothes clean around the abscess. °· Keep all doctor visits as told. °GET HELP RIGHT AWAY IF:  °· You have more pain, puffiness (swelling), or redness in the wound site. °· You have more fluid or blood coming from the wound site. °· You have muscle aches, chills, or you feel sick. °· You have a fever. °MAKE SURE YOU:  °· Understand these instructions. °· Will watch your condition. °· Will get help right away if you are not doing well or get worse. °  °This information is not intended to replace advice given to you by your health care provider. Make sure you discuss any questions you have with your health care provider. °  °Document Released: 09/25/2007 Document Revised: 10/08/2011 Document Reviewed: 06/22/2011 °Elsevier Interactive Patient Education ©2016 [employer] ° °

## 2015-01-28 NOTE — ED Notes (Signed)
Pt with a knot on back

## 2015-02-06 ENCOUNTER — Encounter: Payer: Self-pay | Admitting: *Deleted

## 2015-02-06 ENCOUNTER — Other Ambulatory Visit: Payer: Self-pay | Admitting: *Deleted

## 2015-02-09 ENCOUNTER — Ambulatory Visit (INDEPENDENT_AMBULATORY_CARE_PROVIDER_SITE_OTHER): Payer: 59 | Admitting: Surgery

## 2015-02-09 ENCOUNTER — Encounter: Payer: Self-pay | Admitting: Surgery

## 2015-02-09 VITALS — BP 135/76 | HR 69 | Temp 98.3°F | Ht 73.0 in | Wt 272.0 lb

## 2015-02-09 DIAGNOSIS — L723 Sebaceous cyst: Secondary | ICD-10-CM

## 2015-02-09 NOTE — Patient Instructions (Signed)
Follow up at your next appointment on 03/23/15 with Dr Orvis BrillLoflin for a in office procedure to remove your sebaceous cyst.

## 2015-02-09 NOTE — Progress Notes (Signed)
Patient ID: Edwin Reed, male   DOB: 27-Apr-1955, 59 y.o.   MRN: [id number]  Chief Complaint  Patient presents with  . Cyst    located on right shoulder    HPI Location, Quality, Duration, Severity, Timing, Context, Modifying Factors, Associated Signs and Symptoms.  Edwin Reed is a 59 y.o. male.  With a several week history of pain and swelling around an infected sebaceous cyst on his right shoulder. Of note the patient is had a lump in this area for several years and has had recurrent infection. Recently he went to urgent care for which was placed on antibiotics with great improvement and referred to the office for definitive surgical excision. The patient is known to me from a complex past surgical history dating back to 2015 with recurrent appendicitis [facility]. He's having minimal to any drainage from the cyst this point.  Past Medical History  Diagnosis Date  . Diabetes mellitus without complication [facility](HCC)    Past surgical history significant for laparotomy 3 with repair of enterotomy drainage of intra-abdominal abscess and appendectomy.  Past medical history significant for diabetes hypertension hypogonadism and appendicitis.  Social History Social History  Substance Use Topics  . Smoking status: Never Smoker   . Smokeless tobacco: None  . Alcohol Use: No    No Known Allergies  Current Outpatient Prescriptions  Medication Sig Dispense Refill  . ACCU-CHEK AVIVA PLUS test strip TEST BLOOD SUGAR QD UTD  3  . amLODipine (NORVASC) 10 MG tablet Take 10 mg by mouth.    Marland Kitchen. aspirin 81 MG tablet Take 81 mg by mouth.    Marland Kitchen. atorvastatin (LIPITOR) 80 MG tablet Take 80 mg by mouth.    . celecoxib (CELEBREX) 200 MG capsule Take 200 mg by mouth.    . enalapril (VASOTEC) 20 MG tablet Take 20 mg by mouth.    . fluticasone (FLONASE) 50 MCG/ACT nasal spray 2 sprays by Each Nare route daily.    Marland Kitchen. glucose blood test  strip     . hydrochlorothiazide (HYDRODIURIL) 25 MG tablet Take 25 mg by mouth.    . insulin aspart protamine- aspart (NOVOLOG MIX 70/30) (70-30) 100 UNIT/ML injection Inject into the skin.    Marland Kitchen. insulin NPH-regular Human (NOVOLIN 70/30) (70-30) 100 UNIT/ML injection Inject into the skin.    . metFORMIN (GLUMETZA) 1000 MG (MOD) 24 hr tablet Take 1,000 mg by mouth.    . metoprolol (LOPRESSOR) 50 MG tablet Take 75 mg by mouth.    . mupirocin ointment (BACTROBAN) 2 % Apply 1 application topically 3 (three) times daily. 22 g 1  . sildenafil (VIAGRA) 100 MG tablet Take 100 mg by mouth.    . sulfamethoxazole-trimethoprim (BACTRIM DS,SEPTRA DS) 800-160 MG tablet Take 1 tablet by mouth 2 (two) times daily.     No current facility-administered medications for this visit.    Blood pressure 135/76, pulse 69, temperature 98.3 F (36.8 C), temperature source Oral, height 6\' 1"  (1.854 m), weight 272 lb (123.378 kg).  No results found for this or any previous visit (from the past 48 hour(s)). No results found.  Review of Systems  Constitutional: Negative for fever.  HENT: Negative.   Eyes: Negative.   Respiratory: Negative for cough and hemoptysis.   Gastrointestinal: Negative for heartburn, nausea, vomiting, abdominal pain and diarrhea.  Genitourinary: Negative.   Musculoskeletal: Negative.   Endo/Heme/Allergies: Negative.   All other systems reviewed and are negative.  Physical Exam  Constitutional: He is oriented to person, place, and time and well-developed, well-nourished, and in no distress. No distress.  HENT:  Head: Atraumatic.  Cardiovascular: Normal rate.   Pulmonary/Chest: Effort normal.  Abdominal: Soft. He exhibits no distension. A hernia is present.    Neurological: He is oriented to person, place, and time.  Skin: Skin is warm. He is not diaphoretic.  There is a 3 x 4 cm inflamed sebaceous cyst without cellulitis on the right posterior shoulder.  Psychiatric: Mood, memory,  affect and judgment normal.   Physical Exam CONSTITUTIONAL:  Pleasant, well-developed, well-nourished, and in no acute distress. EYES: Pupils equal and reactive to light, Sclera non-icteric EARS, NOSE, MOUTH AND THROAT:  The oropharynx was clear.  Dentition is good repair.  Oral mucosa pink and moist. LYMPH NODES:  Lymph nodes in the neck and axillae were normal RESPIRATORY:  Lungs were clear.  Normal respiratory effort without pathologic use of accessory muscles of respiration CARDIOVASCULAR: Heart was regular without murmurs.  There were no carotid bruits. GI: The abdomen was soft, nontender, and nondistended. There were no palpable masses. There was no hepatosplenomegaly. There were normal bowel sounds in all quadrants. GU:  Rectal deferred.   MUSCULOSKELETAL:  Normal muscle strength and tone.  No clubbing or cyanosis.   SKIN:  There were no pathologic skin lesions.  There were no nodules on palpation. NEUROLOGIC:  Sensation is normal.  Cranial nerves are grossly intact. PSYCH:  Oriented to person, place and time.  Mood and affect are normal.  Data Reviewed  I have personally reviewed the patient's imaging, laboratory findings and medical records.    Assessment    Resolving infection of right posterior shoulder sebaceous cyst. The area is still too indurated for elective excision. I discussed this with him and he is in agreement.    Plan    I will have him seen in the office in 6 weeks for elective excision at that time. He is in agreement.         Natale Lay MD, FACS 02/09/2015, 1:17 PM

## 2015-03-21 ENCOUNTER — Telehealth: Payer: Self-pay

## 2015-03-21 NOTE — Telephone Encounter (Signed)
Called patient and left him a voicemail. We need to reschedule his appointment for 8:00 AM on Thursday 03/23/2015 since he might need his cyst to be excised.

## 2015-03-23 ENCOUNTER — Ambulatory Visit (INDEPENDENT_AMBULATORY_CARE_PROVIDER_SITE_OTHER): Payer: 59 | Admitting: Surgery

## 2015-03-23 ENCOUNTER — Ambulatory Visit: Payer: 59 | Admitting: Surgery

## 2015-03-23 ENCOUNTER — Encounter: Payer: Self-pay | Admitting: Surgery

## 2015-03-23 VITALS — BP 164/93 | HR 65 | Temp 98.3°F | Ht 73.0 in

## 2015-03-23 DIAGNOSIS — L723 Sebaceous cyst: Secondary | ICD-10-CM

## 2015-03-23 NOTE — Patient Instructions (Addendum)
Your follow-up [facility] for suture [facility] at 0830am

## 2015-03-23 NOTE — Progress Notes (Signed)
[age]yr old male with history of sebaceous cyst to right shoulder.  Patient states he has had this area for about 13 years.  He states that it will become bigger inflamed and occasionally drain material.  Patient states that he had a few years in between this happening so ignored the area for a while but after the last time, (about 6 weeks ago) he wanted it removed.  He denies any fever, chills, redness or drainage since a few weeks back.    Filed Vitals:   03/23/15 0822  BP: 164/93  Pulse: 65  Temp: 98.3 F (36.8 C)    PE:  Gen: NAD Res: CTAB/L  Cardio: RRR Abd: soft, nt, nd well healed midline scar Ext: no edema Right shoulder: 1cm area of mobile soft mass with punctum, no erythema or drainage  A/P:  I discussed the risks and benefits of excision including bleeding, infection, seroma, hematoma and recurrence.  Patient given the opportunity to ask questions and have them answered.  Patient agreed to procedure and consent obtained.   Sebaceous Cyst Excision Procedure Note  Pre-operative Diagnosis: Epidermal cyst  Post-operative Diagnosis: same  Locations:right shoulder  Indications: Cyst on back that has been inflamed and drained in the past.  The last time this happened was about 6 weeks ago but improved since then.   Anesthesia: Bupivacaine 0.25% with epinephrine without added sodium bicarbonate  Procedure Details  History of allergy to iodine: no  Patient informed of the risks (including bleeding and infection) and benefits of the  procedure and Written informed consent obtained.  The lesion and surrounding area was given a sterile prep using betadyne and draped in the usual sterile fashion. An incision was made over the cyst, which was dissected free of the surrounding tissue and removed.  The cyst was filled with typical sebaceous material.  Hemostasis was achieved using 3-0 vicryl figure of eight sutures.  The wound was closed with 3-0 Vicryl in the deep dermis and 3-0 Nylon  horizontal mattress stitches to close the skin. Antibiotic ointment and a sterile dressing applied.  The specimen was sent for pathologic examination. The patient tolerated the procedure well.  EBL: 20 ml  Findings: Sebaceous cyst with some sebum extraction  Condition: Stable  Complications: none.  Plan: 1. Instructed to keep the wound dry and covered for 24-48h and clean thereafter. 2. Warning signs of infection were reviewed.   3. Recommended that the patient use NSAID as needed for pain.  4. Return for suture removal in 1 week.

## 2015-03-29 ENCOUNTER — Encounter (INDEPENDENT_AMBULATORY_CARE_PROVIDER_SITE_OTHER): Payer: Self-pay

## 2015-03-29 ENCOUNTER — Ambulatory Visit (INDEPENDENT_AMBULATORY_CARE_PROVIDER_SITE_OTHER): Payer: 59

## 2015-03-29 DIAGNOSIS — L723 Sebaceous cyst: Secondary | ICD-10-CM

## 2015-03-29 NOTE — Progress Notes (Signed)
Pt returned to clinic today for suture removal on left shoulder. Sutures removed with no difficulty. Pt tolerated well. No drainage or fever noted.

## 2015-04-04 ENCOUNTER — Telehealth: Payer: Self-pay

## 2015-04-04 NOTE — Telephone Encounter (Signed)
Called to give pathology results to patient. No answer. Left voicemail for return phone call.

## 2015-04-05 NOTE — Telephone Encounter (Signed)
Called patient once again at this time to give pathology results. Went straight to [employer]email. Left Voice message for return phone call.

## 2015-04-06 ENCOUNTER — Telehealth: Payer: Self-pay | Admitting: Surgery

## 2015-04-06 NOTE — Telephone Encounter (Signed)
Patient returned phone call. Results to pathology given. Encouraged patient to call back with any questions or concerns. Her verbalizes understanding of conversation.

## 2015-04-06 NOTE — Telephone Encounter (Signed)
Patient is returning a phone call for results

## 2015-04-06 NOTE — Telephone Encounter (Signed)
Returned phone call to patient at this time. Went straight to [employer]email. Left voicemail for return phone call.

## 2015-10-13 ENCOUNTER — Ambulatory Visit (INDEPENDENT_AMBULATORY_CARE_PROVIDER_SITE_OTHER): Payer: 59 | Admitting: Sports Medicine

## 2015-10-13 ENCOUNTER — Encounter: Payer: Self-pay | Admitting: Sports Medicine

## 2015-10-13 DIAGNOSIS — L608 Other nail disorders: Secondary | ICD-10-CM

## 2015-10-13 DIAGNOSIS — E119 Type 2 diabetes mellitus without complications: Secondary | ICD-10-CM | POA: Diagnosis not present

## 2015-10-13 DIAGNOSIS — B351 Tinea unguium: Secondary | ICD-10-CM

## 2015-10-13 DIAGNOSIS — M79674 Pain in right toe(s): Secondary | ICD-10-CM

## 2015-10-13 DIAGNOSIS — L609 Nail disorder, unspecified: Secondary | ICD-10-CM

## 2015-10-13 DIAGNOSIS — M79675 Pain in left toe(s): Secondary | ICD-10-CM | POA: Diagnosis not present

## 2015-10-13 NOTE — Progress Notes (Signed)
Patient ID: Edwin Reed, male   DOB: [birth year], 60 y.o.   MRN: [id number] Subjective: Edwin Reed is a 60 y.o. male patient with history of diabetes who presents to office today complaining of blood underneath right big toenail and long, painful nails  while ambulating in shoes; unable to trim. Patient states that he is not sure how he got blood underneath his nail. 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 Diagnosed with diabetes over 20 years ago; Patient denies any new changes in medication or new problems. Patient denies any new cramping, numbness, burning or tingling in the legs.  Patient Active Problem List   Diagnosis Date Noted  . Sebaceous cyst 03/23/2015  . FOM (frequency of micturition) 07/14/2014  . History of colon polyps 11/04/2013  . 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 Type 2 diabetes mellitus (HCC) 06/28/2011  . Adiposity 06/28/2011  . Congested 06/28/2011  . Benign hypertension 06/28/2011  . 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 ACCU-CHEK AVIVA PLUS test strip TEST BLOOD SUGAR QD UTD  3  . amLODipine (NORVASC) 10 MG tablet Take 10 mg by mouth.    Marland Kitchen. aspirin 81 MG tablet Take 81 mg by mouth.    Marland Kitchen. atorvastatin (LIPITOR) 80 MG tablet Take 80 mg by mouth.    . celecoxib (CELEBREX) 200 MG capsule Take 200 mg by mouth.    . enalapril (VASOTEC) 20 MG tablet Take 20 mg by mouth daily.    . fluticasone (FLONASE) 50 MCG/ACT nasal spray 2 sprays by Each Nare route daily.    Marland Kitchen. glucose blood test strip     . insulin aspart protamine- aspart (NOVOLOG MIX 70/30) (70-30) 100 UNIT/ML injection Inject into the skin.    Marland Kitchen. insulin NPH-regular Human (NOVOLIN 70/30) (70-30) 100 UNIT/ML injection Inject into the skin.    . metFORMIN (GLUMETZA) 1000 MG (MOD) 24 hr tablet Take 1,000 mg by  mouth.    . metoprolol (LOPRESSOR) 50 MG tablet Take 75 mg by mouth.    . metoprolol (LOPRESSOR) 50 MG tablet Take 75 mg by mouth 2 (two) times daily.    . sildenafil (VIAGRA) 100 MG tablet Take 100 mg by mouth.     No current facility-administered medications on file prior to visit.   No Known Allergies  No results found for this or any previous visit (from the past 2160 hour(s)).  Objective: General: Patient is awake, alert, and oriented x 3 and in no acute distress.  Integument: Skin is warm, dry and supple bilateral. Nails are tender, long, thickened and  dystrophic with subungual debris, consistent with onychomycosis, 1-5 bilateral. Underneath the right hallux nail. There is dry blood, comprising about 40% of the nail with no lysis or signs of infection. No open lesions or preulcerative lesions present bilateral. Remaining integument unremarkable.  Vasculature:  Dorsalis Pedis pulse 2/4 bilateral. Posterior Tibial pulse  2/4 bilateral.  Capillary fill time <3 sec 1-5 bilateral. Positive hair growth to the level of the digits. Temperature gradient within normal limits. No varicosities present bilateral. No edema present bilateral.   Neurology: The patient has intact sensation measured with a 5.07/10g Semmes Weinstein Monofilament at all pedal sites bilateral . Vibratory sensation intact bilateral with tuning fork. No Babinski sign present bilateral.   Musculoskeletal: No symptomatic pedal deformities noted bilateral. Muscular strength 5/5 in  all lower extremity muscular groups bilateral without pain on range of motion . No tenderness with calf compression bilateral.  Assessment and Plan: Problem List Items Addressed This Visit    None    Visit Diagnoses    Dermatophytosis of nail    -  Primary    Nail hemorrhage        Right hallux with no infection    Toe pain, bilateral        Diabetes mellitus without complication (HCC)           -Examined patient. -Discussed and  educated patient on diabetic foot care, especially with  regards to the vascular, neurological and musculoskeletal systems.  -Stressed the importance of good glycemic control and the detriment of not  controlling glucose levels in relation to the foot. -Mechanically debrided all nails 1-5 bilateral using sterile nail nipper and filed with dremel without incident  -Advised patient that dry blood under right hallux nail will continue to grow out and that we will closely monitor nail as this happens -Answered all patient questions -Patient to return  in 3 months for at risk foot care -Patient advised to call the office if any problems or questions arise in the meantime.  Asencion Islamitorya Docia Klar, DPM

## 2015-10-17 ENCOUNTER — Encounter: Payer: Self-pay | Admitting: Surgery

## 2016-02-22 DIAGNOSIS — M170 Bilateral primary osteoarthritis of knee: Secondary | ICD-10-CM | POA: Insufficient documentation

## 2016-04-16 ENCOUNTER — Other Ambulatory Visit: Payer: Self-pay | Admitting: Specialist

## 2016-04-19 ENCOUNTER — Encounter
Admission: RE | Admit: 2016-04-19 | Discharge: 2016-04-19 | Disposition: A | Discharge status: Home / Self Care | Payer: 59 | Source: Ambulatory Visit | Attending: Specialist | Admitting: Specialist

## 2016-04-19 DIAGNOSIS — Z01812 Encounter for preprocedural laboratory examination: Secondary | ICD-10-CM | POA: Diagnosis not present

## 2016-04-19 HISTORY — DX: Essential (primary) hypertension: I10

## 2016-04-19 HISTORY — DX: Unspecified osteoarthritis, unspecified site: M19.90

## 2016-04-19 LAB — CBC
HCT: 37.1 % — ABNORMAL LOW (ref 40.0–52.0)
HEMOGLOBIN: 12.7 g/dL — AB (ref 13.0–18.0)
MCH: 31.6 pg (ref 26.0–34.0)
MCHC: 34.3 g/dL (ref 32.0–36.0)
MCV: 92.2 fL (ref 80.0–100.0)
Platelets: 218 10*3/uL (ref 150–440)
RBC: 4.03 MIL/uL — AB (ref 4.40–5.90)
RDW: 13.3 % (ref 11.5–14.5)
WBC: 5.4 10*3/uL (ref 3.8–10.6)

## 2016-04-19 LAB — BASIC METABOLIC PANEL
ANION GAP: 8 (ref 5–15)
BUN: 30 mg/dL — ABNORMAL HIGH (ref 6–20)
CHLORIDE: 105 mmol/L (ref 101–111)
CO2: 24 mmol/L (ref 22–32)
CREATININE: 1.45 mg/dL — AB (ref 0.61–1.24)
Calcium: 9.3 mg/dL (ref 8.9–10.3)
GFR calc non Af Amer: 51 mL/min — ABNORMAL LOW (ref >60)
GFR, EST AFRICAN AMERICAN: 59 mL/min — AB (ref >60)
Glucose, Bld: 160 mg/dL — ABNORMAL HIGH (ref 65–99)
POTASSIUM: 4.6 mmol/L (ref 3.5–5.1)
Sodium: 137 mmol/L (ref 135–145)

## 2016-04-19 LAB — TYPE AND SCREEN
ABO/RH(D): A POS
Antibody Screen: NEGATIVE

## 2016-04-19 LAB — DIFFERENTIAL
Basophils Absolute: 0 10*3/uL (ref 0–0.1)
Basophils Relative: 1 %
EOS PCT: 4 %
Eosinophils Absolute: 0.2 10*3/uL (ref 0–0.7)
LYMPHS ABS: 1.7 10*3/uL (ref 1.0–3.6)
LYMPHS PCT: 31 %
Monocytes Absolute: 0.5 10*3/uL (ref 0.2–1.0)
Monocytes Relative: 9 %
NEUTROS ABS: 3 10*3/uL (ref 1.4–6.5)
NEUTROS PCT: 55 %

## 2016-04-19 LAB — PROTIME-INR
INR: 0.99
PROTHROMBIN TIME: 13.1 s (ref 11.4–15.2)

## 2016-04-19 LAB — SURGICAL PCR SCREEN
MRSA, PCR: NEGATIVE
Staphylococcus aureus: NEGATIVE

## 2016-04-19 NOTE — Patient Instructions (Signed)
  Your procedure is scheduled on:Wednesday Jan. 10 , 2018. Report to Same Day Surgery. To find out your arrival time please call [phone] between 1PM - 3PM on Tuesday Jan. 9, 2018.  Remember: Instructions that are not followed completely may result in serious medical risk, up to and including death, or upon the discretion of your surgeon and anesthesiologist your surgery may need to be rescheduled.    _x___ 1. Do not eat food or drink liquids after midnight. No gum chewing or hard candies.     _x___ 2. No Alcohol for 24 hours before or after surgery.   ____ 3. Bring all medications with you on the day of surgery if instructed.    __x__ 4. Notify your doctor if there is any change in your medical condition     (cold, fever, infections).    _____ 5. No smoking 24 hours prior to surgery.     Do not wear jewelry, make-up, hairpins, clips or nail polish.  Do not wear lotions, powders, or perfumes.   Do not shave 48 hours prior to surgery. Men may shave face and neck.  Do not bring valuables to the hospital.    [facility] is not responsible for any belongings or valuables.               Contacts, dentures or bridgework may not be worn into surgery.  Leave your suitcase in the car. After surgery it may be brought to your room.  For patients admitted to the hospital, discharge time is determined by your treatment team.   Patients discharged the day of surgery will not be allowed to drive home.    Please read over the following fact sheets that you were given:   [facility] Pre[facility]  _x___ Take these medicines the morning of surgery with A SIP OF WATER:    1. amLODipine (NORVASC)  2. enalapril (VASOTEC)  3. metoprolol (LOPRESSOR)   ____ Fleet Enema (as directed)   _x___ Use CHG Soap as directed on instruction sheet  ____ Use inhalers on the day of surgery and bring to hospital day of surgery  _x___ Stop metformin 2 days prior to surgery = Jan. 8th,  2018.    _x___ Take 1/2 of usual insulin dose the night before surgery and none on the morning of  surgery.   _x___ Stop aspirin as directed by Dr. Hyacinth MeekerMiller, ask at Jan. 4th appt.  _x___ Stop Anti-inflammatories such as Advil, Aleve, Ibuprofen, Motrin, Naproxen, Naprosyn, Goodies powders or aspirin products 7 days prior to surgery. OK to  take Tylenol.   ____ Stop supplements until after surgery.    ____ Bring C-Pap to the hospital.

## 2016-04-23 ENCOUNTER — Inpatient Hospital Stay: Admission: RE | Admit: 2016-04-23 | Payer: 59 | Source: Ambulatory Visit

## 2016-05-01 ENCOUNTER — Inpatient Hospital Stay: Payer: 59

## 2016-05-01 ENCOUNTER — Inpatient Hospital Stay: Payer: 59 | Admitting: Anesthesiology

## 2016-05-01 ENCOUNTER — Encounter: Payer: Self-pay | Admitting: *Deleted

## 2016-05-01 ENCOUNTER — Inpatient Hospital Stay
Admission: RE | Admit: 2016-05-01 | Discharge: 2016-05-03 | DRG: 470 | Disposition: A | Discharge status: Home Health | Payer: 59 | Source: Ambulatory Visit | Attending: Specialist | Admitting: Specialist

## 2016-05-01 ENCOUNTER — Encounter
Admission: RE | Disposition: A | Discharge status: Home Health | Payer: Self-pay | Source: Ambulatory Visit | Attending: Specialist

## 2016-05-01 DIAGNOSIS — Z87891 Personal history of nicotine dependence: Secondary | ICD-10-CM

## 2016-05-01 DIAGNOSIS — M1711 Unilateral primary osteoarthritis, right knee: Principal | ICD-10-CM | POA: Diagnosis present

## 2016-05-01 DIAGNOSIS — I10 Essential (primary) hypertension: Secondary | ICD-10-CM | POA: Diagnosis present

## 2016-05-01 DIAGNOSIS — Z79899 Other long term (current) drug therapy: Secondary | ICD-10-CM | POA: Diagnosis not present

## 2016-05-01 DIAGNOSIS — E119 Type 2 diabetes mellitus without complications: Secondary | ICD-10-CM | POA: Diagnosis present

## 2016-05-01 DIAGNOSIS — Z96659 Presence of unspecified artificial knee joint: Secondary | ICD-10-CM

## 2016-05-01 DIAGNOSIS — Z794 Long term (current) use of insulin: Secondary | ICD-10-CM | POA: Diagnosis not present

## 2016-05-01 DIAGNOSIS — M6281 Muscle weakness (generalized): Secondary | ICD-10-CM

## 2016-05-01 HISTORY — PX: TOTAL KNEE ARTHROPLASTY: SHX125

## 2016-05-01 LAB — CBC
HCT: 34.3 % — ABNORMAL LOW (ref 40.0–52.0)
Hemoglobin: 11.6 g/dL — ABNORMAL LOW (ref 13.0–18.0)
MCH: 31.2 pg (ref 26.0–34.0)
MCHC: 33.7 g/dL (ref 32.0–36.0)
MCV: 92.4 fL (ref 80.0–100.0)
PLATELETS: 210 10*3/uL (ref 150–440)
RBC: 3.71 MIL/uL — ABNORMAL LOW (ref 4.40–5.90)
RDW: 13.6 % (ref 11.5–14.5)
WBC: 6.4 10*3/uL (ref 3.8–10.6)

## 2016-05-01 LAB — GLUCOSE, CAPILLARY
GLUCOSE-CAPILLARY: 197 mg/dL — AB (ref 65–99)
Glucose-Capillary: 114 mg/dL — ABNORMAL HIGH (ref 65–99)
Glucose-Capillary: 92 mg/dL (ref 65–99)
Glucose-Capillary: 186 mg/dL — ABNORMAL HIGH (ref 65–99)

## 2016-05-01 LAB — CREATININE, SERUM
CREATININE: 1.2 mg/dL (ref 0.61–1.24)
GFR calc Af Amer: >60 mL/min (ref >60)
GFR calc non Af Amer: >60 mL/min (ref >60)

## 2016-05-01 LAB — ABO/RH: ABO/RH(D): A POS

## 2016-05-01 SURGERY — ARTHROPLASTY, KNEE, TOTAL
Anesthesia: Spinal | Site: Knee | Laterality: Left | Wound class: Clean

## 2016-05-01 MED ORDER — KETOROLAC TROMETHAMINE 30 MG/ML IJ SOLN
INTRAMUSCULAR | Status: AC
  Filled 2016-05-01: qty 1

## 2016-05-01 MED ORDER — FLUTICASONE PROPIONATE 50 MCG/ACT NA SUSP
1.0000 | Freq: Every day | NASAL | Status: DC
  Administered 2016-05-02: 1 via NASAL
  Filled 2016-05-01: qty 16

## 2016-05-01 MED ORDER — SODIUM CHLORIDE 0.9 % IJ SOLN
INTRAMUSCULAR | Status: DC | PRN
  Administered 2016-05-01: 30 mL via INTRAVENOUS

## 2016-05-01 MED ORDER — FAMOTIDINE 20 MG PO TABS: 20.0000 mg | ORAL_TABLET | Freq: Once | ORAL | Status: DC

## 2016-05-01 MED ORDER — PROPOFOL 10 MG/ML IV BOLUS
INTRAVENOUS | Status: AC
  Filled 2016-05-01: qty 40

## 2016-05-01 MED ORDER — TRANEXAMIC ACID 1000 MG/10ML IV SOLN
1000.0000 mg | INTRAVENOUS | Status: AC
  Administered 2016-05-01: 1000 mg via INTRAVENOUS
  Filled 2016-05-01: qty 10

## 2016-05-01 MED ORDER — EPINEPHRINE PF 1 MG/ML IJ SOLN
INTRAMUSCULAR | Status: AC
  Filled 2016-05-01: qty 1

## 2016-05-01 MED ORDER — CELECOXIB 200 MG PO CAPS
200.0000 mg | ORAL_CAPSULE | Freq: Every day | ORAL | Status: DC
  Administered 2016-05-01 – 2016-05-02 (×2): 200 mg via ORAL
  Filled 2016-05-01 (×2): qty 1

## 2016-05-01 MED ORDER — BUPIVACAINE-EPINEPHRINE (PF) 0.25% -1:200000 IJ SOLN
INTRAMUSCULAR | Status: AC
  Filled 2016-05-01: qty 30

## 2016-05-01 MED ORDER — METOCLOPRAMIDE HCL 5 MG/ML IJ SOLN: 5.0000 mg | Freq: Three times a day (TID) | INTRAMUSCULAR | Status: DC | PRN

## 2016-05-01 MED ORDER — SILDENAFIL CITRATE 100 MG PO TABS: 100.0000 mg | ORAL_TABLET | ORAL | Status: DC | PRN

## 2016-05-01 MED ORDER — CEFAZOLIN SODIUM-DEXTROSE 2-4 GM/100ML-% IV SOLN
2.0000 g | INTRAVENOUS | Status: AC
  Administered 2016-05-01: 2 g via INTRAVENOUS

## 2016-05-01 MED ORDER — DIPHENHYDRAMINE HCL 12.5 MG/5ML PO ELIX: 12.5000 mg | ORAL_SOLUTION | ORAL | Status: DC | PRN

## 2016-05-01 MED ORDER — CELECOXIB 200 MG PO CAPS
ORAL_CAPSULE | ORAL | Status: AC
  Filled 2016-05-01: qty 2

## 2016-05-01 MED ORDER — ENOXAPARIN SODIUM 40 MG/0.4ML ~~LOC~~ SOLN
40.0000 mg | SUBCUTANEOUS | Status: DC
  Administered 2016-05-02 – 2016-05-03 (×2): 40 mg via SUBCUTANEOUS
  Filled 2016-05-01 (×2): qty 0.4

## 2016-05-01 MED ORDER — MAGNESIUM HYDROXIDE 400 MG/5ML PO SUSP
30.0000 mL | Freq: Every day | ORAL | Status: DC | PRN
  Administered 2016-05-02: 30 mL via ORAL
  Filled 2016-05-01: qty 30

## 2016-05-01 MED ORDER — LIDOCAINE HCL (CARDIAC) 20 MG/ML IV SOLN
INTRAVENOUS | Status: DC | PRN
  Administered 2016-05-01: 100 mg via INTRAVENOUS

## 2016-05-01 MED ORDER — GLYCOPYRROLATE 0.2 MG/ML IJ SOLN
INTRAMUSCULAR | Status: DC | PRN
  Administered 2016-05-01 (×3): 0.2 mg via INTRAVENOUS

## 2016-05-01 MED ORDER — EPINEPHRINE PF 1 MG/ML IJ SOLN
INTRAMUSCULAR | Status: DC | PRN
  Administered 2016-05-01: .001 mL via INTRATHECAL

## 2016-05-01 MED ORDER — HYDROCHLOROTHIAZIDE 25 MG PO TABS
25.0000 mg | ORAL_TABLET | Freq: Every day | ORAL | Status: DC
  Administered 2016-05-01 – 2016-05-03 (×3): 25 mg via ORAL
  Filled 2016-05-01 (×3): qty 1

## 2016-05-01 MED ORDER — PREGABALIN 75 MG PO CAPS
75.0000 mg | ORAL_CAPSULE | Freq: Two times a day (BID) | ORAL | Status: DC
  Administered 2016-05-01 – 2016-05-02 (×3): 75 mg via ORAL
  Filled 2016-05-01 (×5): qty 1

## 2016-05-01 MED ORDER — SODIUM CHLORIDE 0.9 % IV SOLN
INTRAVENOUS | Status: DC
  Administered 2016-05-01 (×2): via INTRAVENOUS

## 2016-05-01 MED ORDER — ONDANSETRON HCL 4 MG PO TABS: 4.0000 mg | ORAL_TABLET | Freq: Four times a day (QID) | ORAL | Status: DC | PRN

## 2016-05-01 MED ORDER — BUPIVACAINE LIPOSOME 1.3 % IJ SUSP
INTRAMUSCULAR | Status: AC
  Filled 2016-05-01: qty 20

## 2016-05-01 MED ORDER — ADULT MULTIVITAMIN W/MINERALS CH
1.0000 | ORAL_TABLET | Freq: Every day | ORAL | Status: DC
  Administered 2016-05-01 – 2016-05-03 (×3): 1 via ORAL
  Filled 2016-05-01 (×3): qty 1

## 2016-05-01 MED ORDER — FLEET ENEMA 7-19 GM/118ML RE ENEM: 1.0000 | ENEMA | Freq: Once | RECTAL | Status: DC | PRN

## 2016-05-01 MED ORDER — CHLORHEXIDINE GLUCONATE CLOTH 2 % EX PADS: 6.0000 | MEDICATED_PAD | Freq: Once | CUTANEOUS | Status: DC

## 2016-05-01 MED ORDER — FENTANYL CITRATE (PF) 100 MCG/2ML IJ SOLN
25.0000 ug | INTRAMUSCULAR | Status: DC | PRN
  Administered 2016-05-01 (×4): 25 ug via INTRAVENOUS

## 2016-05-01 MED ORDER — GLYCOPYRROLATE 0.2 MG/ML IJ SOLN
INTRAMUSCULAR | Status: AC
  Filled 2016-05-01: qty 2

## 2016-05-01 MED ORDER — CELECOXIB 200 MG PO CAPS
400.0000 mg | ORAL_CAPSULE | ORAL | Status: AC
  Administered 2016-05-01: 400 mg via ORAL

## 2016-05-01 MED ORDER — ONDANSETRON HCL 4 MG/2ML IJ SOLN: 4.0000 mg | Freq: Four times a day (QID) | INTRAMUSCULAR | Status: DC | PRN

## 2016-05-01 MED ORDER — BUPIVACAINE-EPINEPHRINE (PF) 0.25% -1:200000 IJ SOLN
INTRAMUSCULAR | Status: DC | PRN
  Administered 2016-05-01: 30 mL via PERINEURAL

## 2016-05-01 MED ORDER — SENNA 8.6 MG PO TABS
1.0000 | ORAL_TABLET | Freq: Two times a day (BID) | ORAL | Status: DC
  Administered 2016-05-01 – 2016-05-03 (×5): 8.6 mg via ORAL
  Filled 2016-05-01 (×5): qty 1

## 2016-05-01 MED ORDER — INSULIN ASPART PROT & ASPART (70-30 MIX) 100 UNIT/ML ~~LOC~~ SUSP
30.0000 [IU] | Freq: Every day | SUBCUTANEOUS | Status: DC
  Administered 2016-05-02 – 2016-05-03 (×2): 30 [IU] via SUBCUTANEOUS
  Filled 2016-05-01 (×2): qty 30

## 2016-05-01 MED ORDER — FENTANYL CITRATE (PF) 100 MCG/2ML IJ SOLN
INTRAMUSCULAR | Status: AC
  Administered 2016-05-01: 25 ug via INTRAVENOUS
  Filled 2016-05-01: qty 2

## 2016-05-01 MED ORDER — METOPROLOL TARTRATE 50 MG PO TABS
75.0000 mg | ORAL_TABLET | Freq: Two times a day (BID) | ORAL | Status: DC
  Administered 2016-05-01 – 2016-05-03 (×4): 75 mg via ORAL
  Filled 2016-05-01 (×5): qty 1

## 2016-05-01 MED ORDER — SODIUM CHLORIDE 0.9 % IV SOLN
1000.0000 mg | Freq: Once | INTRAVENOUS | Status: AC
  Administered 2016-05-01: 1000 mg via INTRAVENOUS
  Filled 2016-05-01: qty 10

## 2016-05-01 MED ORDER — ACETAMINOPHEN 325 MG PO TABS: 650.0000 mg | ORAL_TABLET | Freq: Four times a day (QID) | ORAL | Status: DC | PRN

## 2016-05-01 MED ORDER — PHENYLEPHRINE HCL 10 MG/ML IJ SOLN
INTRAMUSCULAR | Status: DC | PRN
  Administered 2016-05-01: 50 ug/min via INTRAVENOUS

## 2016-05-01 MED ORDER — NEOMYCIN-POLYMYXIN B GU 40-200000 IR SOLN
Status: DC | PRN
  Administered 2016-05-01: 16 mL

## 2016-05-01 MED ORDER — GLYCOPYRROLATE 0.2 MG/ML IJ SOLN
INTRAMUSCULAR | Status: AC
  Filled 2016-05-01: qty 1

## 2016-05-01 MED ORDER — PREGABALIN 75 MG PO CAPS
75.0000 mg | ORAL_CAPSULE | ORAL | Status: AC
  Administered 2016-05-01: 75 mg via ORAL

## 2016-05-01 MED ORDER — KETOROLAC TROMETHAMINE 30 MG/ML IJ SOLN
INTRAMUSCULAR | Status: DC | PRN
  Administered 2016-05-01: 30 mg via INTRAVENOUS

## 2016-05-01 MED ORDER — VANCOMYCIN HCL 10 G IV SOLR
1500.0000 mg | Freq: Two times a day (BID) | INTRAVENOUS | Status: AC
  Administered 2016-05-02: 1500 mg via INTRAVENOUS
  Filled 2016-05-01 (×3): qty 1500

## 2016-05-01 MED ORDER — LIRAGLUTIDE 18 MG/3ML ~~LOC~~ SOPN
1.8000 mg | PEN_INJECTOR | SUBCUTANEOUS | Status: DC
  Administered 2016-05-03: 1.8 mg via SUBCUTANEOUS
  Filled 2016-05-01: qty 3

## 2016-05-01 MED ORDER — METHOCARBAMOL 500 MG PO TABS
500.0000 mg | ORAL_TABLET | Freq: Four times a day (QID) | ORAL | Status: DC | PRN
  Administered 2016-05-02: 500 mg via ORAL
  Filled 2016-05-01: qty 1

## 2016-05-01 MED ORDER — ACETAMINOPHEN 650 MG RE SUPP: 650.0000 mg | Freq: Four times a day (QID) | RECTAL | Status: DC | PRN

## 2016-05-01 MED ORDER — AMLODIPINE BESYLATE 10 MG PO TABS
10.0000 mg | ORAL_TABLET | Freq: Every day | ORAL | Status: DC
  Administered 2016-05-02 – 2016-05-03 (×2): 10 mg via ORAL
  Filled 2016-05-01 (×2): qty 1

## 2016-05-01 MED ORDER — SODIUM CHLORIDE 0.9 % IJ SOLN
INTRAMUSCULAR | Status: AC
  Filled 2016-05-01: qty 50

## 2016-05-01 MED ORDER — OXYCODONE HCL 5 MG PO TABS
5.0000 mg | ORAL_TABLET | ORAL | Status: DC | PRN
Start: 2016-05-01 — End: 2016-05-03
  Administered 2016-05-01 – 2016-05-03 (×6): 10 mg via ORAL
  Filled 2016-05-01 (×6): qty 2

## 2016-05-01 MED ORDER — MORPHINE SULFATE (PF) 4 MG/ML IV SOLN
INTRAVENOUS | Status: DC | PRN
  Administered 2016-05-01: 4 mg via INTRAVENOUS

## 2016-05-01 MED ORDER — BUPIVACAINE HCL (PF) 0.5 % IJ SOLN
INTRAMUSCULAR | Status: AC
  Filled 2016-05-01: qty 10

## 2016-05-01 MED ORDER — ONDANSETRON HCL 4 MG/2ML IJ SOLN
INTRAMUSCULAR | Status: AC
  Filled 2016-05-01: qty 2

## 2016-05-01 MED ORDER — MIDAZOLAM HCL 5 MG/5ML IJ SOLN
INTRAMUSCULAR | Status: DC | PRN
  Administered 2016-05-01: 2 mg via INTRAVENOUS

## 2016-05-01 MED ORDER — TRANEXAMIC ACID 1000 MG/10ML IV SOLN
INTRAVENOUS | Status: AC
  Filled 2016-05-01: qty 10

## 2016-05-01 MED ORDER — BISACODYL 10 MG RE SUPP: 10.0000 mg | Freq: Every day | RECTAL | Status: DC | PRN

## 2016-05-01 MED ORDER — ACETAMINOPHEN 10 MG/ML IV SOLN
INTRAVENOUS | Status: DC | PRN
  Administered 2016-05-01: 1000 mg via INTRAVENOUS

## 2016-05-01 MED ORDER — BUPIVACAINE LIPOSOME 1.3 % IJ SUSP
INTRAMUSCULAR | Status: DC | PRN
  Administered 2016-05-01: 60 mL

## 2016-05-01 MED ORDER — VANCOMYCIN HCL 10 G IV SOLR
1500.0000 mg | INTRAVENOUS | Status: AC
  Administered 2016-05-01: 1500 mg via INTRAVENOUS
  Filled 2016-05-01: qty 1500

## 2016-05-01 MED ORDER — FERROUS SULFATE 325 (65 FE) MG PO TABS
325.0000 mg | ORAL_TABLET | Freq: Three times a day (TID) | ORAL | Status: DC
Start: 2016-05-01 — End: 2016-05-03
  Administered 2016-05-01 – 2016-05-03 (×5): 325 mg via ORAL
  Filled 2016-05-01 (×7): qty 1

## 2016-05-01 MED ORDER — METHOCARBAMOL 1000 MG/10ML IJ SOLN
500.0000 mg | Freq: Four times a day (QID) | INTRAVENOUS | Status: DC | PRN
  Filled 2016-05-01: qty 5

## 2016-05-01 MED ORDER — BUPIVACAINE HCL (PF) 0.5 % IJ SOLN
INTRAMUSCULAR | Status: DC | PRN
  Administered 2016-05-01: 3 mL

## 2016-05-01 MED ORDER — NEOMYCIN-POLYMYXIN B GU 40-200000 IR SOLN
Status: AC
  Filled 2016-05-01: qty 20

## 2016-05-01 MED ORDER — PHENYLEPHRINE HCL 10 MG/ML IJ SOLN
INTRAMUSCULAR | Status: AC
  Filled 2016-05-01: qty 1

## 2016-05-01 MED ORDER — PROPOFOL 500 MG/50ML IV EMUL
INTRAVENOUS | Status: AC
  Filled 2016-05-01: qty 50

## 2016-05-01 MED ORDER — MORPHINE SULFATE (PF) 2 MG/ML IV SOLN: 1.0000 mg | INTRAVENOUS | Status: DC | PRN

## 2016-05-01 MED ORDER — MIDAZOLAM HCL 2 MG/2ML IJ SOLN
INTRAMUSCULAR | Status: AC
  Filled 2016-05-01: qty 2

## 2016-05-01 MED ORDER — METFORMIN HCL ER 500 MG PO TB24
500.0000 mg | ORAL_TABLET | Freq: Two times a day (BID) | ORAL | Status: DC
  Administered 2016-05-02 – 2016-05-03 (×3): 500 mg via ORAL
  Filled 2016-05-01 (×4): qty 1

## 2016-05-01 MED ORDER — MENTHOL 3 MG MT LOZG: 1.0000 | LOZENGE | OROMUCOSAL | Status: DC | PRN

## 2016-05-01 MED ORDER — ENALAPRIL MALEATE 10 MG PO TABS
20.0000 mg | ORAL_TABLET | Freq: Every day | ORAL | Status: DC
  Administered 2016-05-02 – 2016-05-03 (×2): 20 mg via ORAL
  Filled 2016-05-01 (×2): qty 2

## 2016-05-01 MED ORDER — PREGABALIN 75 MG PO CAPS
ORAL_CAPSULE | ORAL | Status: AC
  Filled 2016-05-01: qty 1

## 2016-05-01 MED ORDER — CELECOXIB 200 MG PO CAPS
200.0000 mg | ORAL_CAPSULE | Freq: Two times a day (BID) | ORAL | Status: DC
  Administered 2016-05-02 – 2016-05-03 (×2): 200 mg via ORAL
  Filled 2016-05-01 (×3): qty 1

## 2016-05-01 MED ORDER — ALUM & MAG HYDROXIDE-SIMETH 200-200-20 MG/5ML PO SUSP: 30.0000 mL | ORAL | Status: DC | PRN

## 2016-05-01 MED ORDER — IBUPROFEN 400 MG PO TABS: 400.0000 mg | ORAL_TABLET | Freq: Four times a day (QID) | ORAL | Status: DC | PRN

## 2016-05-01 MED ORDER — METOCLOPRAMIDE HCL 10 MG PO TABS: 5.0000 mg | ORAL_TABLET | Freq: Three times a day (TID) | ORAL | Status: DC | PRN

## 2016-05-01 MED ORDER — SODIUM CHLORIDE 0.45 % IV SOLN
INTRAVENOUS | Status: DC
  Administered 2016-05-01 – 2016-05-02 (×2): via INTRAVENOUS

## 2016-05-01 MED ORDER — ACETAMINOPHEN 500 MG PO TABS
1000.0000 mg | ORAL_TABLET | Freq: Four times a day (QID) | ORAL | Status: AC
  Administered 2016-05-01 – 2016-05-02 (×4): 1000 mg via ORAL
  Filled 2016-05-01 (×4): qty 2

## 2016-05-01 MED ORDER — PHENOL 1.4 % MT LIQD: 1.0000 | OROMUCOSAL | Status: DC | PRN

## 2016-05-01 MED ORDER — PROPOFOL 10 MG/ML IV BOLUS
INTRAVENOUS | Status: DC | PRN
  Administered 2016-05-01: 30 mg via INTRAVENOUS
  Administered 2016-05-01: 20 mg via INTRAVENOUS

## 2016-05-01 MED ORDER — MORPHINE SULFATE (PF) 4 MG/ML IV SOLN
INTRAVENOUS | Status: AC
  Filled 2016-05-01: qty 1

## 2016-05-01 MED ORDER — ATORVASTATIN CALCIUM 20 MG PO TABS
80.0000 mg | ORAL_TABLET | Freq: Every evening | ORAL | Status: DC
  Administered 2016-05-02: 80 mg via ORAL
  Filled 2016-05-01 (×2): qty 4

## 2016-05-01 MED ORDER — ONDANSETRON HCL 4 MG/2ML IJ SOLN: 4.0000 mg | Freq: Once | INTRAMUSCULAR | Status: DC | PRN

## 2016-05-01 MED ORDER — ACETAMINOPHEN 10 MG/ML IV SOLN
INTRAVENOUS | Status: AC
  Filled 2016-05-01: qty 100

## 2016-05-01 MED ORDER — CEFAZOLIN SODIUM-DEXTROSE 2-4 GM/100ML-% IV SOLN
INTRAVENOUS | Status: AC
  Filled 2016-05-01: qty 100

## 2016-05-01 MED ORDER — PHENYLEPHRINE HCL 10 MG/ML IJ SOLN
INTRAMUSCULAR | Status: DC | PRN
  Administered 2016-05-01: 120 ug via INTRAVENOUS

## 2016-05-01 MED ORDER — ONDANSETRON HCL 4 MG/2ML IJ SOLN
INTRAMUSCULAR | Status: DC | PRN
  Administered 2016-05-01: 4 mg via INTRAVENOUS

## 2016-05-01 SURGICAL SUPPLY — 59 items
AUTOTRANSFUS HAS 1/8 (MISCELLANEOUS) ×3
BLADE DEBAKEY 8.0 (BLADE) ×2 IMPLANT
BLADE DEBAKEY 8.0MM (BLADE) ×1
BLADE SAGITTAL WIDE XTHICK NO (BLADE) ×6 IMPLANT
CANISTER SUCT 1200ML W/VALVE (MISCELLANEOUS) ×3 IMPLANT
CANISTER SUCT 3000ML (MISCELLANEOUS) ×3 IMPLANT
CAP KNEE TOTAL 3 SIGMA ×3 IMPLANT
CATH TRAY METER 16FR LF (MISCELLANEOUS) ×3 IMPLANT
CEMENT HV SMART SET (Cement) ×6 IMPLANT
CHLORAPREP W/TINT 26ML (MISCELLANEOUS) ×6 IMPLANT
COOLER POLAR GLACIER W/PUMP (MISCELLANEOUS) ×3 IMPLANT
CUFF TOURN 24 STER (MISCELLANEOUS) IMPLANT
CUFF TOURN 30 STER DUAL PORT (MISCELLANEOUS) IMPLANT
CUP MEDICINE 2OZ PLAST GRAD ST (MISCELLANEOUS) ×6 IMPLANT
DECANTER SPIKE VIAL GLASS SM (MISCELLANEOUS) ×9 IMPLANT
DRAPE INCISE IOBAN 66X60 STRL (DRAPES) ×3 IMPLANT
DRAPE SHEET LG 3/4 BI-LAMINATE (DRAPES) ×3 IMPLANT
DRSG AQUACEL AG ADV 3.5X10 (GAUZE/BANDAGES/DRESSINGS) ×3 IMPLANT
DRSG AQUACEL AG ADV 3.5X14 (GAUZE/BANDAGES/DRESSINGS) ×3 IMPLANT
ELECT REM PT RETURN 9FT ADLT (ELECTROSURGICAL) ×3
ELECTRODE REM PT RTRN 9FT ADLT (ELECTROSURGICAL) ×1 IMPLANT
GLOVE BIO SURGEON STRL SZ8 (GLOVE) ×3 IMPLANT
GLOVE BIOGEL PI IND STRL 8.5 (GLOVE) ×1 IMPLANT
GLOVE BIOGEL PI INDICATOR 8.5 (GLOVE) ×2
GLOVE INDICATOR 8.0 STRL GRN (GLOVE) ×6 IMPLANT
GLOVE SURG ORTHO 8.0 STRL STRW (GLOVE) ×3 IMPLANT
GLOVE SURG ORTHO 8.5 STRL (GLOVE) ×6 IMPLANT
GOWN STRL REUS W/ TWL LRG LVL3 (GOWN DISPOSABLE) ×2 IMPLANT
GOWN STRL REUS W/TWL LRG LVL3 (GOWN DISPOSABLE) ×4
GOWN STRL REUS W/TWL LRG LVL4 (GOWN DISPOSABLE) ×3 IMPLANT
HANDPIECE INTERPULSE COAX TIP (DISPOSABLE) ×2
IMMBOLIZER KNEE 19 BLUE UNIV (SOFTGOODS) ×3 IMPLANT
KIT RM TURNOVER STRD PROC AR (KITS) ×3 IMPLANT
NEEDLE 18GX1X1/2 (RX/OR ONLY) (NEEDLE) ×3 IMPLANT
NEEDLE SPNL 20GX3.5 QUINCKE YW (NEEDLE) ×3 IMPLANT
NS IRRIG 1000ML POUR BTL (IV SOLUTION) ×3 IMPLANT
PACK TOTAL KNEE (MISCELLANEOUS) ×3 IMPLANT
PAD WRAPON POLAR KNEE (MISCELLANEOUS) ×1 IMPLANT
SET HNDPC FAN SPRY TIP SCT (DISPOSABLE) ×1 IMPLANT
SOL .9 NS 3000ML IRR  AL (IV SOLUTION) ×2
SOL .9 NS 3000ML IRR UROMATIC (IV SOLUTION) ×1 IMPLANT
SPONGE LAP 18X18 5 PK (GAUZE/BANDAGES/DRESSINGS) ×3 IMPLANT
STAPLER SKIN PROX 35W (STAPLE) ×3 IMPLANT
SUCTION FRAZIER HANDLE 10FR (MISCELLANEOUS) ×2
SUCTION TUBE FRAZIER 10FR DISP (MISCELLANEOUS) ×1 IMPLANT
SUT BONE WAX W31G (SUTURE) ×3 IMPLANT
SUT DVC 2 QUILL PDO  T11 36X36 (SUTURE) ×2
SUT DVC 2 QUILL PDO T11 36X36 (SUTURE) ×1 IMPLANT
SUT ORTHOCORD OS-6 NDL 36 (SUTURE) ×3 IMPLANT
SUT QUILL PDO 0 36 36 VIOLET (SUTURE) ×3 IMPLANT
SUT VIC AB 0 CT1 36 (SUTURE) ×3 IMPLANT
SUT VIC AB 2-0 CT1 (SUTURE) ×3 IMPLANT
SYR 20CC LL (SYRINGE) ×6 IMPLANT
SYR 50ML LL SCALE MARK (SYRINGE) ×6 IMPLANT
SYSTEM AUTOTRANSFUS DUAL TROCR (MISCELLANEOUS) ×1 IMPLANT
TAPE MICROFOAM 4IN (TAPE) ×3 IMPLANT
TOWER CARTRIDGE SMART MIX (DISPOSABLE) ×3 IMPLANT
TUBE SUCT KAM VAC (TUBING) ×3 IMPLANT
WRAPON POLAR PAD KNEE (MISCELLANEOUS) ×3

## 2016-05-01 NOTE — H&P (Signed)
THE PATIENT WAS SEEN PRIOR TO SURGERY TODAY.  HISTORY, ALLERGIES, HOME MEDICATIONS AND OPERATIVE PROCEDURE WERE REVIEWED. RISKS AND BENEFITS OF SURGERY DISCUSSED WITH PATIENT AGAIN.  NO CHANGES FROM INITIAL HISTORY AND PHYSICAL NOTED.    

## 2016-05-01 NOTE — Anesthesia Procedure Notes (Signed)
Spinal  Start time: 05/01/2016 7:48 AM End time: 05/01/2016 7:55 AM Staffing Anesthesiologist: Yves DillARROLL, PAUL Resident/CRNA: Irving BurtonBACHICH, Corbett Moulder Performed: resident/CRNA  Preanesthetic Checklist Completed: patient identified, site marked, surgical consent, pre-op evaluation, timeout performed, IV checked, risks and benefits discussed and monitors and equipment checked Spinal Block Patient position: sitting Prep: ChloraPrep Patient monitoring: heart rate, continuous pulse ox and blood pressure Approach: midline Location: L3-4 Injection technique: single-shot Needle Needle type: Whitacre  Needle gauge: 25 G Needle length: 10 cm

## 2016-05-01 NOTE — NC FL2 (Signed)
[place] MEDICAID FL2 LEVEL OF CARE SCREENING TOOL     IDENTIFICATION  Patient Name: Edwin Reed Birthdate: Aug 21, 1955 Sex: male Admission Date (Current Location): 05/01/2016  [place]   Facility and Address:  [facility], [address], [place], [place] [address]      Provider Number: [id number]  Attending Physician Name and Address:  Deeann SaintHoward Miller, MD  Relative Name and Phone Number:       Current Level of Care: Hospital Recommended Level of Care: Skilled Nursing Facility Prior Approval Number:    Date Approved/Denied:   PASRR Number:  [id number])  Discharge Plan: SNF    Current Diagnoses: Patient Active Problem List   Diagnosis Date Noted  . 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 Sebaceous cyst 03/23/2015  . FOM (frequency of micturition) 07/14/2014  . History of colon polyps 11/04/2013  . 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 Type 2 diabetes mellitus (HCC) 06/28/2011  . Adiposity 06/28/2011  . Congested 06/28/2011  . Benign hypertension 06/28/2011  . 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    Orientation RESPIRATION BLADDER Height & Weight     Self, Time, Situation, Place  Normal Continent Weight:   Height:     BEHAVIORAL SYMPTOMS/MOOD NEUROLOGICAL BOWEL NUTRITION STATUS   (none)  (none) Continent Diet (Diet: Carb Modified )  AMBULATORY STATUS COMMUNICATION OF NEEDS Skin   Extensive Assist Verbally Surgical wounds (Incision: Left Knee )                       Personal Care Assistance Level of Assistance  Bathing, Feeding, Dressing Bathing Assistance: Limited assistance Feeding assistance: Independent Dressing Assistance: Limited assistance     Functional Limitations Info  Sight, Hearing, Speech Sight Info: Adequate Hearing Info: Adequate Speech Info: Adequate    SPECIAL CARE FACTORS FREQUENCY  PT (By licensed PT), OT (By licensed OT)     PT Frequency:   (5) OT Frequency:  (5)            Contractures      Additional Factors Info  Code Status, Allergies Code Status Info:  (Full Code. ) Allergies Info:  (No Known Allergies. )           Current Medications (05/01/2016):  This is the current hospital active medication list Current Facility-Administered Medications  Medication Dose Route Frequency Provider Last Rate Last Dose  . 0.45 % sodium chloride infusion   Intravenous Continuous Deeann SaintHoward Miller, MD 75 mL/hr at 05/01/16 1347    . acetaminophen (TYLENOL) tablet 650 mg  650 mg Oral Q6H PRN Deeann SaintHoward Miller, MD       Or  . acetaminophen (TYLENOL) suppository 650 mg  650 mg Rectal Q6H PRN Deeann SaintHoward Miller, MD      . acetaminophen (TYLENOL) tablet 1,000 mg  1,000 mg Oral Q6H Deeann SaintHoward Miller, MD   1,000 mg at 05/01/16 1407  . alum & mag hydroxide-simeth (MAALOX/MYLANTA) 200-200-20 MG/5ML suspension 30 mL  30 mL Oral Q4H PRN Deeann SaintHoward Miller, MD      . Melene Muller[START ON 05/02/2016] amLODipine (NORVASC) tablet 10 mg  10 mg Oral Daily Deeann SaintHoward Miller, MD      . atorvastatin (LIPITOR) tablet 80 mg  80 mg Oral QPM Deeann SaintHoward Miller, MD      . bisacodyl (DULCOLAX) suppository 10 mg  10 mg Rectal Daily PRN Deeann SaintHoward Miller, MD      . celecoxib (CELEBREX) 200 MG capsule           .  celecoxib (CELEBREX) capsule 200 mg  200 mg Oral Daily Deeann Saint, MD      . celecoxib (CELEBREX) capsule 200 mg  200 mg Oral Q12H Deeann Saint, MD      . diphenhydrAMINE (BENADRYL) 12.5 MG/5ML elixir 12.5-25 mg  12.5-25 mg Oral Q4H PRN Deeann Saint, MD      . Melene Muller ON 05/02/2016] enalapril (VASOTEC) tablet 20 mg  20 mg Oral Daily Deeann Saint, MD      . Melene Muller ON 05/02/2016] enoxaparin (LOVENOX) injection 40 mg  40 mg Subcutaneous Q24H Deeann Saint, MD      . ferrous sulfate tablet 325 mg  325 mg Oral TID PC Deeann Saint, MD   325 mg at 05/01/16 1405  . fluticasone (FLONASE) 50 MCG/ACT nasal spray 1 spray  1 spray Each Nare Daily Deeann Saint, MD      . hydrochlorothiazide (HYDRODIURIL)  tablet 25 mg  25 mg Oral Daily Deeann Saint, MD   25 mg at 05/01/16 1405  . ibuprofen (ADVIL,MOTRIN) tablet 400 mg  400 mg Oral Q6H PRN Deeann Saint, MD      . Melene Muller ON 05/02/2016] insulin aspart protamine- aspart (NOVOLOG MIX 70/30) injection 30 Units  30 Units Subcutaneous Q breakfast Deeann Saint, MD      . Melene Muller ON 05/02/2016] liraglutide SOPN 1.8 mg  1.8 mg Subcutaneous BH-q7a Deeann Saint, MD      . magnesium hydroxide (MILK OF MAGNESIA) suspension 30 mL  30 mL Oral Daily PRN Deeann Saint, MD      . menthol-cetylpyridinium (CEPACOL) lozenge 3 mg  1 lozenge Oral PRN Deeann Saint, MD       Or  . phenol (CHLORASEPTIC) mouth spray 1 spray  1 spray Mouth/Throat PRN Deeann Saint, MD      . metFORMIN (GLUCOPHAGE-XR) 24 hr tablet 500 mg  500 mg Oral BID WC Deeann Saint, MD      . methocarbamol (ROBAXIN) tablet 500 mg  500 mg Oral Q6H PRN Deeann Saint, MD       Or  . methocarbamol (ROBAXIN) 500 mg in dextrose 5 % 50 mL IVPB  500 mg Intravenous Q6H PRN Deeann Saint, MD      . metoCLOPramide (REGLAN) tablet 5-10 mg  5-10 mg Oral Q8H PRN Deeann Saint, MD       Or  . metoCLOPramide (REGLAN) injection 5-10 mg  5-10 mg Intravenous Q8H PRN Deeann Saint, MD      . metoprolol tartrate (LOPRESSOR) tablet 75 mg  75 mg Oral BID Deeann Saint, MD      . morphine 2 MG/ML injection 1 mg  1 mg Intravenous Q2H PRN Deeann Saint, MD      . multivitamin with minerals tablet 1 tablet  1 tablet Oral Daily Deeann Saint, MD      . ondansetron [facility]) tablet 4 mg  4 mg Oral Q6H PRN Deeann Saint, MD       Or  . ondansetron [facility]) injection 4 mg  4 mg Intravenous Q6H PRN Deeann Saint, MD      . oxyCODONE (Oxy IR/ROXICODONE) immediate release tablet 5-10 mg  5-10 mg Oral Q3H PRN Deeann Saint, MD      . pregabalin (LYRICA) 75 MG capsule           . pregabalin (LYRICA) capsule 75 mg  75 mg Oral BID Deeann Saint, MD   75 mg at 05/01/16 1406  . senna (SENOKOT) tablet 8.6 mg  1 tablet Oral BID Deeann Saint, MD  8.6 mg at 05/01/16 1407  . sodium phosphate (FLEET) 7-19 GM/118ML enema 1 enema  1 enema Rectal Once PRN Deeann Saint, MD      . vancomycin (VANCOCIN) 1,500 mg in sodium chloride 0.9 % 500 mL IVPB  1,500 mg Intravenous Q12H Deeann Saint, MD         Discharge Medications: Please see discharge summary for a list of discharge medications.  Relevant Imaging Results:  Relevant Lab Results:   Additional Information  (SSN: [id number])  Shawntia Mangal, Darleen Crocker, LCSW

## 2016-05-01 NOTE — Op Note (Signed)
DATE OF SURGERY:  05/01/2016 TIME: 11:05 AM  PATIENT NAME:  Edwin Reed   AGE: 61 y.o.    PRE-OPERATIVE DIAGNOSIS:  M17.11 Bilateral primary osteoarthritis of right knee  POST-OPERATIVE DIAGNOSIS:  Same  PROCEDURE:  Procedure(s): TOTAL KNEE ARTHROPLASTY RIGHT KNEE WITH DEPUY LCS ROTATING PLATFORM   SURGEON:  Kurtis Anastasia E, MD   ASSISTANT: MAURICE JONES, PAC  OPERATIVE IMPLANTS: Depuy LCS Femur/Patella size  LARGE, Tibia size #5,  Rotating platform polyethylene size 10 MM    Total tourniquet time was 141  minutes.  PREOPERATIVE INDICATIONS:  Edwin PettyWillie V Arcand is a 61 y.o. year old male with end stage bone on bone degenerative arthritis of the knee who failed conservative treatment, including injections, antiinflammatories, activity modification, and assistive devices, and had significant impairment of their activities of daily living, and elected for Total Knee Arthroplasty.   The risks, benefits, and alternatives were discussed at length including but not limited to the risks of infection, bleeding, nerve injury, stiffness, blood clots, the need for revision surgery, cardiopulmonary complications, among others, and they were willing to proceed.  OPERATIVE FINDINGS AND UNIQUE ASPECTS OF THE CASE:  Tight, scarred extensor mechanism.  Extremely hard bone  OPERATIVE DESCRIPTION:   The patient was brought to the operative room and placed in a supine position. Spinal anesthesia was administered. IV Vancomycin antibiotics were given. The lower extremity was prepped and draped in the usual sterile fashion. Time out was performed. The leg was elevated and exsanguinated and the tourniquet was inflated to 350 mmHg  An anterior midline incision was made.  Anterior quadriceps tendon splitting approach was performed. The patella was everted and osteophytes were removed. The anterior horn of the medial and lateral meniscus was removed.  Then the extramedullary tibial cutting jig was utilized  making the appropriate cut using the anterior tibial crest as a reference building in appropriate posterior slope. Care was taken during the cut to protect the medial and collateral ligaments. The proximal tibia was removed along with the posterior horns of the menisci. The PCL was sacrificed.  The distal femur was sized as above . Medial release was carried out. The anterior femoral cutting guide was aligned and centering hole made. The rotation guide was inserted and the anterior cutting guide pinned in place, and was in excellent alignment. The posterior femoral cuts were made. The flexion gap was established. The distal femoral cutting guide was introduced at 4 of valgus. This was pinned and the distal femoral cut made. The extension gap was established and was stable. The finishing guide was applied and finishing cuts made. The Mchale retractor was inserted and the keeled tibial trial was pinned in place. Centering hole was made and the keel inserted. The femoral component was inserted along with a polyethylene  insert and the knee articulated.  Extension and flexion showed good stability throughout. The patella was then sized and cut made for the patellar component. Centering holes were made. The trial was inserted and the knee articulated nicely with no need for lateral release. The trials were all removed and the knee thoroughly irrigated with pulsed lavage. Exparil was injected. The knee was dried and the cement mixed. The  keeled tibial component,  femoral component and patellar components were all cemented in place and excess cement was removed. The cement was allowed to harden for 10 minutes. Further irrigation was carried out. Bone wax was applied to all raw bony surfaces. Autovac drains were inserted. Quarter percent plain Marcaine, Toradol and  morphine were injected. The capsule was closed with #2 Quill suture, and the subcutaneous tissues were closed with 0 Quill suture. The skin was closed with  staples.Sponge and needle counts were correct.  Aquacel dressing with TENS pads and a dry sterile dressing were applied. Polar Care and knee immobilizer were applied. Tourniquet was deflated with excellent return of blood flow to foot. Patient was transferred to a hospital bed and taken to the recovery room in good condition.  Valinda Hoar, MD

## 2016-05-01 NOTE — Progress Notes (Signed)
Pt remains with cpm machine in place, received oxycodone for pain 8/10. Family at bedside, pt eating dinner.

## 2016-05-01 NOTE — Transfer of Care (Signed)
Immediate Anesthesia Transfer of Care Note  Patient: Edwin Reed  Procedure(s) Performed: Procedure(s): TOTAL KNEE ARTHROPLASTY (Left)  Patient Location: PACU  Anesthesia Type:Spinal  Level of Consciousness: awake  Airway & Oxygen Therapy: Patient Spontanous Breathing  Post-op Assessment: Post -op Vital signs reviewed and stable  Post vital signs: stable  Last Vitals:  Vitals:   05/01/16 0618 05/01/16 1113  BP: 139/86 111/73  Pulse: 66 69  Resp: 16 (!) 21  Temp: 36.6 C (!) 36 C    Last Pain:  Vitals:   05/01/16 1113  TempSrc: Temporal         Complications: No apparent anesthesia complications

## 2016-05-01 NOTE — Anesthesia Preprocedure Evaluation (Signed)
Anesthesia Evaluation  Patient identified by MRN, date of birth, ID band Patient awake    Reviewed: Allergy & Precautions, NPO status , Patient's Chart, lab work & pertinent test results, reviewed documented beta blocker date and time   Airway Mallampati: III  TM Distance: >3 FB     Dental   Pulmonary former smoker,    Pulmonary exam normal        Cardiovascular hypertension, Pt. on medications and Pt. on home beta blockers Normal cardiovascular exam     Neuro/Psych negative neurological ROS  negative psych ROS   GI/Hepatic negative GI ROS, Neg liver ROS,   Endo/Other  diabetes, Well Controlled, Type 2, Insulin Dependent, Oral Hypoglycemic Agents  Renal/GU negative Renal ROS  negative genitourinary   Musculoskeletal  (+) Arthritis , Osteoarthritis,    Abdominal Normal abdominal exam  (+)   Peds negative pediatric ROS (+)  Hematology negative hematology ROS (+)   Anesthesia Other Findings   Reproductive/Obstetrics                            Anesthesia Physical Anesthesia Plan  ASA: III  Anesthesia Plan: Spinal   Post-op Pain Management:    Induction: Intravenous  Airway Management Planned: Nasal Cannula  Additional Equipment:   Intra-op Plan:   Post-operative Plan:   Informed Consent: I have reviewed the patients History and Physical, chart, labs and discussed the procedure including the risks, benefits and alternatives for the proposed anesthesia with the patient or authorized representative who has indicated his/her understanding and acceptance.   Dental advisory given  Plan Discussed with: CRNA and Surgeon  Anesthesia Plan Comments:         Anesthesia Quick Evaluation

## 2016-05-01 NOTE — Progress Notes (Signed)
Pt arrived from PACU via bed. Pt is alert and oriented, denied pain, lungs clear bilat, HR regular, abdomen is soft, bs heard. L knee with dressing and polar care in place, pt is able to wiggle toes and move foot. Skin intact except for incision. pIV #20 intact to Rac, 1/2ns hung at 5875mls/hr, site free of redness and swelling. Foley catheter is draining yellow urine, autovac attached to L knee with 200mls present on arrival. Since arrival, pt has had l leg placed in CPM per Dr.'s order, tends unit has been placed, and Autovac had a total of 300mls drainage at 4 hour mark, this has been transfused into pt, pt now has a hemovac in place. Pt has not required any pain medication, and is tolerating diet. This Clinical research associatewriter reviewed MD orders with pt, wife now at bedside. Pt oriented to room and call bell. Srx2.

## 2016-05-01 NOTE — Progress Notes (Signed)
PT Cancellation Note  Patient Details Name: Edwin Reed MRN: [id number] DOB: 03/04/1956   Cancelled Treatment:    Reason Eval/Treat Not Completed: Other (comment). 2nd attempt however pt still receiving autovac. Will hold session at this time. Re-attempt next date.   Shawnika Pepin 05/01/2016, 4:26 PM  Elizabeth PalauStephanie Priscilla Kirstein, PT, DPT [phone]

## 2016-05-01 NOTE — Progress Notes (Signed)
PT Cancellation Note  Patient Details Name: Edwin Reed MRN: [id number] DOB: 05/11/1955   Cancelled Treatment:    Reason Eval/Treat Not Completed: Other (comment). Consult received and chart reviewed. Evaluation attempted, however pt currently getting blood transfusion. RN request to wait until finished. Not able to participate in therapy at this time. Will re-attempt another time.   Rania Prothero 05/01/2016, 4:09 PM  [name], PT, DPT [phone]

## 2016-05-02 LAB — CBC
HEMATOCRIT: 32.9 % — AB (ref 40.0–52.0)
Hemoglobin: 11.3 g/dL — ABNORMAL LOW (ref 13.0–18.0)
MCH: 31.9 pg (ref 26.0–34.0)
MCHC: 34.3 g/dL (ref 32.0–36.0)
MCV: 93.2 fL (ref 80.0–100.0)
PLATELETS: 179 10*3/uL (ref 150–440)
RBC: 3.53 MIL/uL — ABNORMAL LOW (ref 4.40–5.90)
RDW: 13.7 % (ref 11.5–14.5)
WBC: 8.9 10*3/uL (ref 3.8–10.6)

## 2016-05-02 LAB — BASIC METABOLIC PANEL
ANION GAP: 4 — AB (ref 5–15)
BUN: 19 mg/dL (ref 6–20)
CALCIUM: 7.8 mg/dL — AB (ref 8.9–10.3)
CO2: 23 mmol/L (ref 22–32)
CREATININE: 1.38 mg/dL — AB (ref 0.61–1.24)
Chloride: 107 mmol/L (ref 101–111)
GFR, EST NON AFRICAN AMERICAN: 54 mL/min — AB (ref >60)
Glucose, Bld: 236 mg/dL — ABNORMAL HIGH (ref 65–99)
Potassium: 4.2 mmol/L (ref 3.5–5.1)
SODIUM: 134 mmol/L — AB (ref 135–145)

## 2016-05-02 LAB — GLUCOSE, CAPILLARY
GLUCOSE-CAPILLARY: 226 mg/dL — AB (ref 65–99)
GLUCOSE-CAPILLARY: 227 mg/dL — AB (ref 65–99)
Glucose-Capillary: 248 mg/dL — ABNORMAL HIGH (ref 65–99)
Glucose-Capillary: 208 mg/dL — ABNORMAL HIGH (ref 65–99)
Glucose-Capillary: 228 mg/dL — ABNORMAL HIGH (ref 65–99)

## 2016-05-02 NOTE — Progress Notes (Signed)
Foley d/c'd at 0555 

## 2016-05-02 NOTE — Evaluation (Signed)
Occupational Therapy Evaluation Patient Details Name: Edwin Reed MRN: [id number] DOB: 05/25/1955 Today's Date: 05/02/2016    History of Present Illness Pt is pleasant 61 yo male s/p L TKR, has history of diabetes, underwent blood transfusion 05/01/16   Clinical Impression   Pt 61 yo male s/p L TKR, presenting with pain, decreased strength and ROM in LLE and functional deficits for LB ADL tasks. Pt educated in use of AE for LB dressing tasks and pt able to return demonstrate technique with minimal assist to adjust sock positioning on foot and verbal cues for technique. Pt would benefit from skilled OT services at next venue of care (recommending HH OT) to address noted impairments and functional deficits to ensure maximization of functional independence with ADL/IADL and minimize falls risk.    Follow Up Recommendations  Home health OT    Equipment Recommendations  Tub/shower seat    Recommendations for Other Services       Precautions / Restrictions Precautions Precautions: Fall Required Braces or Orthoses: Knee Immobilizer - Left Knee Immobilizer - Left: On when out of bed or walking Restrictions Weight Bearing Restrictions: Yes Other Position/Activity Restrictions: LLE PWB      Mobility Bed Mobility               General bed mobility comments: not assessed this session, pt was up in chair for OT session  Transfers Overall transfer level: Needs assistance Equipment used: Rolling walker (2 wheeled) Transfers: Sit to/from Stand Sit to Stand: Min guard         General transfer comment: not assessed, pt up in chair at start of session, PT coming shortly after OT    Balance Overall balance assessment: Needs assistance Sitting-balance support: No upper extremity supported Sitting balance-Leahy Scale: Normal Sitting balance - Comments: Able to maintain upright sitting posture w/o UE support   Standing balance support: Bilateral upper extremity  supported Standing balance-Leahy Scale: Good Standing balance comment: Requires RW for additional stability                             ADL Overall ADL's : Needs assistance/impaired Eating/Feeding: Set up;Independent   Grooming: Set up;Independent;Sitting   Upper Body Bathing: Independent;Set up;Sitting   Lower Body Bathing: Minimal assistance;Set up;Sit to/from stand;Cueing for compensatory techniques   Upper Body Dressing : Set up;Independent   Lower Body Dressing: Minimal assistance;With adaptive equipment;Cueing for compensatory techniques Lower Body Dressing Details (indicate cue type and reason): Pt able to don/doff socks using reacher/sock aid with minimal assistance to complete donning and verbal cues for technique   Toilet Transfer Details (indicate cue type and reason): did not attempt this session, continue to assess           General ADL Comments: Spouse will be able to provide necessary level of assistance with LB ADL tasks at home      Vision Vision Assessment?: No apparent visual deficits   Perception     Praxis      Pertinent Vitals/Pain Pain Assessment: 0-10 Pain Score: 5  Pain Location: L knee Pain Descriptors / Indicators: Aching Pain Intervention(s): Limited activity within patient's tolerance;RN gave pain meds during session;Monitored during session     Hand Dominance Right   Extremity/Trunk Assessment Upper Extremity Assessment Upper Extremity Assessment: Overall WFL for tasks assessed   Lower Extremity Assessment Lower Extremity Assessment: Defer to PT evaluation   Cervical / Trunk Assessment Cervical / Trunk Assessment: Normal  Communication Communication Communication: No difficulties   Cognition Arousal/Alertness: Awake/alert Behavior During Therapy: WFL for tasks assessed/performed Overall Cognitive Status: Within Functional Limits for tasks assessed                     General Comments       Exercises    Other Exercises Other Exercises: Sitting therex, AROM 1x10; heel slides, LAQs, marches, pillow squeezes (hip add), to improve functional strength   Shoulder Instructions      Home Living Family/patient expects to be discharged to:: Private residence Living Arrangements: Spouse/significant other Available Help at Discharge: Family;Available PRN/intermittently Type of Home: House Home Access: Stairs to enter Entergy CorporationEntrance Stairs-Number of Steps: 2 Entrance Stairs-Rails: None Home Layout: One level     Bathroom Shower/Tub: Tub/shower unit Shower/tub characteristics: Engineer, building servicesCurtain Bathroom Toilet: Standard Bathroom Accessibility: Yes How Accessible: Accessible via walker;Accessible via wheelchair Home Equipment: Gilmer MorCane - single point;Walker - 2 wheels          Prior Functioning/Environment Level of Independence: Independent        Comments: Independent with all ADL/IADL, 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, lifting, heavy manual labor        OT Problem List: Decreased strength;Decreased range of motion;Decreased knowledge of use of DME or AE   OT Treatment/Interventions:      OT Goals(Current goals can be found in the care plan section) Acute Rehab OT Goals Patient Stated Goal: Return home OT Goal Formulation: With patient/family Time For Goal Achievement: 05/16/16 Potential to Achieve Goals: Good  OT Frequency:     Barriers to D/C:            Co-evaluation              End of Session Equipment Utilized During Treatment: Left knee immobilizer  Activity Tolerance: Patient tolerated treatment well Patient left: in chair;with call bell/phone within reach;with chair alarm set;with family/visitor present   Time: 1610-96041337-1402 OT Time Calculation (min): 25 min Charges:  OT General Charges $OT Visit: 1 Procedure OT Evaluation $OT Eval Low Complexity: 1 Procedure OT Treatments $Self Care/Home Management : 8-22 mins G-Codes:    Eliezer BottomJamie L  Stiller, OTR/L 05/02/2016, 3:19 PM

## 2016-05-02 NOTE — Anesthesia Postprocedure Evaluation (Signed)
Anesthesia Post Note  Patient: Edwin Reed  Procedure(s) Performed: Procedure(s) (LRB): TOTAL KNEE ARTHROPLASTY (Left)  Patient location during evaluation: Nursing Unit Anesthesia Type: Spinal Level of consciousness: awake, awake and alert and oriented Pain management: pain level controlled Vital Signs Assessment: post-procedure vital signs reviewed and stable Respiratory status: spontaneous breathing Cardiovascular status: blood pressure returned to baseline Postop Assessment: no headache, no backache, no signs of nausea or vomiting and adequate PO intake Anesthetic complications: no     Last Vitals:  Vitals:   05/02/16 0115 05/02/16 0518  BP: 107/60 (!) 111/54  Pulse: 78 66  Resp: 18 16  Temp: 36.9 C 36.7 C    Last Pain:  Vitals:   05/02/16 0548  TempSrc:   PainSc: 5                  Gabrian Hoque Lawerance CruelStarr

## 2016-05-02 NOTE — Progress Notes (Signed)
Clinical Social Worker (CSW) received SNF consult. PT is recommending home health. RN case manager aware of above. Please reconsult if future social work needs arise. CSW signing off.   Sejal Cofield, LCSW [phone] 

## 2016-05-02 NOTE — Progress Notes (Signed)
Physical Therapy Treatment Patient Details Name: Edwin Reed MRN: [id number] DOB: [birth year] Today's Date: 05/02/2016    History of Present Illness Pt is pleasant 61 yo male s/p L TKR, has history of diabetes, underwent blood transfusion 05/01/16    PT Comments    Pt willing to participate in PT with pain under control 4/10. Pt ambulated 180 ft with RW and PT superivision. Gait speed was limited due to pain and decreased L knee motion. He maintained PWBing was maintained and L knee immobilizer was worn during ambulation. He actively performed seated therex with verbal cues to improve L knee ROM and to improve functional strength.Pt is progressing well; still displays balance and ambulatory deficits. He will continue to benefit from skilled PT .   Follow Up Recommendations  Home health PT     Equipment Recommendations  None recommended by PT (pt has bariatric walker at home)    Recommendations for Other Services       Precautions / Restrictions Precautions Precautions: Fall Required Braces or Orthoses: Knee Immobilizer - Left Knee Immobilizer - Left: On when out of bed or walking Restrictions Weight Bearing Restrictions: Yes (PWB)    Mobility  Bed Mobility               General bed mobility comments: Not assessed, pt was in chair at beginning of treatment  Transfers Overall transfer level: Needs assistance Equipment used: Rolling walker (2 wheeled) Transfers: Sit to/from Stand Sit to Stand: Min guard         General transfer comment: transfers with L LE anterior and uses UE  Ambulation/Gait Ambulation/Gait assistance: Supervision Ambulation Distance (Feet): 180 Feet Assistive device: Rolling walker (2 wheeled) Gait Pattern/deviations: Step-to pattern;Antalgic;Decreased stride length;Decreased stance time - left;Decreased step length - right   Gait velocity interpretation: Below normal speed for age/gender General Gait Details: gait deviations due to knee  immobilizer and wbing restrictions, was able to use walker safely and maintain PWBing, minimal increase in pain   Stairs            Wheelchair Mobility    Modified Rankin (Stroke Patients Only)       Balance Overall balance assessment: Needs assistance Sitting-balance support: No upper extremity supported Sitting balance-Leahy Scale: Normal Sitting balance - Comments: Able to maintain upright sitting posture w/o UE support   Standing balance support: Bilateral upper extremity supported Standing balance-Leahy Scale: Good Standing balance comment: Requires RW for additional stability                     Cognition Arousal/Alertness: Awake/alert Behavior During Therapy: WFL for tasks assessed/performed Overall Cognitive Status: Within Functional Limits for tasks assessed                      Exercises Other Exercises Other Exercises: Sitting therex, AROM 1x10; heel slides, LAQs, marches, pillow squeezes (hip add), to improve functional strength    General Comments        Pertinent Vitals/Pain Pain Assessment: 0-10 Pain Score: 4  Pain Location: L knee Pain Descriptors / Indicators: Aching Pain Intervention(s): Monitored during session;Limited activity within patient's tolerance;Premedicated before session    Home Living                      Prior Function            PT Goals (current goals can now be found in the care plan section) Acute Rehab PT Goals Patient  Stated Goal: Return home PT Goal Formulation: With patient Time For Goal Achievement: 05/16/16 Potential to Achieve Goals: Good Progress towards PT goals: Progressing toward goals    Frequency    BID      PT Plan Current plan remains appropriate    Co-evaluation             End of Session Equipment Utilized During Treatment: Gait belt;Left knee immobilizer Activity Tolerance: Patient tolerated treatment well Patient left: in chair;with chair alarm set;with  family/visitor present;with call bell/phone within reach     Time: 1610-96041408-1433 PT Time Calculation (min) (ACUTE ONLY): 25 min  Charges:  $Therapeutic Exercise: 8-22 mins                    G Codes:      Matthews Franks 05/02/2016, 2:46 PM

## 2016-05-02 NOTE — Evaluation (Signed)
Physical Therapy Evaluation Patient Details Name: Edwin Reed MRN: [id number] DOB: 08-14-1955 Today's Date: 05/02/2016   History of Present Illness  Pt is pleasant 61 yo male s/p L TKR, has history of diabetes, underwent blood transfusion 05/01/16  Clinical Impression  Pt found in CPM machine upon entering and willing to participate in PT eval. CPM was safely removed by PT before evaluation. Pt has normal sensation in LE's and has active motion in L LE . UE strength and ROM are WFLs. L LE grossly 4/5 for strength and knee flex/ext AAROM was 7/103. Patient able to perform bed mobility (supine to sit) under supervision and minimal verbal cues. Knee immobilizer was applied and PWBing instructions were provided to patient before transferring to standing. During transfers patient was able to stand with min guarding by PT and required use of UE s. Patient ambulated with a step to gait pattern to maintain PWBing status and required min guarding from PT for safety. Pt ambulated 69ft with no increase in knee pain. Pt displays deficits in overall strength, L knee ROM, L knee pain, decreased balance that limit patient's functional mobility. Pt will benefit from skilled PT to address above deficits.     Follow Up Recommendations Home health PT    Equipment Recommendations  Rolling walker with 5" wheels (bariatric)    Recommendations for Other Services       Precautions / Restrictions Precautions Precautions: Fall Required Braces or Orthoses: Knee Immobilizer - Left Knee Immobilizer - Left: On when out of bed or walking Restrictions Weight Bearing Restrictions: Yes (PWB)      Mobility  Bed Mobility Overal bed mobility: Needs Assistance Bed Mobility: Supine to Sit     Supine to sit: Supervision     General bed mobility comments: Able to transfer to EOB with good active movement of L LE and min increase in pain  Transfers Overall transfer level: Needs assistance Equipment used: Rolling  walker (2 wheeled) Transfers: Sit to/from Stand Sit to Stand: Min guard         General transfer comment: Pt able to follow verbal cues to transfer from sitting to rolling walker, uses UE with L LE ant  Ambulation/Gait Ambulation/Gait assistance: Min guard Ambulation Distance (Feet): 80 Feet Assistive device: Rolling walker (2 wheeled) Gait Pattern/deviations: Step-to pattern;Decreased stance time - left;Decreased step length - right;Antalgic   Gait velocity interpretation: Below normal speed for age/gender General Gait Details: gait deviations due to knee immobilizer and wbing restrictions, was able to use walker safely and maintain PWBing   Stairs            Wheelchair Mobility    Modified Rankin (Stroke Patients Only)       Balance Overall balance assessment: Needs assistance Sitting-balance support: No upper extremity supported Sitting balance-Leahy Scale: Normal Sitting balance - Comments: Able to sit at edge of the bed w/o assistance and maintain upright posture   Standing balance support: Bilateral upper extremity supported Standing balance-Leahy Scale: Good Standing balance comment: Requires RW for additional stability                              Pertinent Vitals/Pain Pain Assessment: 0-10 Pain Score: 5  Pain Location: L knee Pain Descriptors / Indicators: Aching Pain Intervention(s): Monitored during session;Premedicated before session    Home Living Family/patient expects to be discharged to:: Private residence Living Arrangements: Spouse/significant other Available Help at Discharge: Family Type of Home: House  Home Access: Stairs to enter Entrance Stairs-Rails: None Entrance Stairs-Number of Steps: 2 Home Layout: One level        Prior Function Level of Independence: Independent               Hand Dominance        Extremity/Trunk Assessment   Upper Extremity Assessment Upper Extremity Assessment: Overall WFL for  tasks assessed    Lower Extremity Assessment Lower Extremity Assessment: LLE deficits/detail LLE Deficits / Details: grossly 4/5 strength and decrease ROM       Communication   Communication: No difficulties  Cognition Arousal/Alertness: Awake/alert Behavior During Therapy: WFL for tasks assessed/performed Overall Cognitive Status: Within Functional Limits for tasks assessed                      General Comments      Exercises Total Joint Exercises Goniometric ROM: 7-103 act assist ROM Other Exercises Other Exercises: Supine therex: AROM 1x10; Ankle pumps, heel slides, SLR, quad sets, SAQs, hip abd - to improve functional strength    Assessment/Plan    PT Assessment Patient needs continued PT services  PT Problem List Decreased strength;Decreased range of motion;Decreased activity tolerance;Decreased balance;Decreased mobility;Decreased knowledge of use of DME;Pain;Decreased knowledge of precautions          PT Treatment Interventions DME instruction;Gait training;Stair training;Functional mobility training;Balance training;Therapeutic exercise;Therapeutic activities;Patient/family education    PT Goals (Current goals can be found in the Care Plan section)  Acute Rehab PT Goals Patient Stated Goal: Return home PT Goal Formulation: With patient Time For Goal Achievement: 05/16/16 Potential to Achieve Goals: Good    Frequency BID   Barriers to discharge Inaccessible home environment      Co-evaluation               End of Session Equipment Utilized During Treatment: Gait belt;Left knee immobilizer Activity Tolerance: Patient tolerated treatment well Patient left: in chair;with call bell/phone within reach;with family/visitor present;with chair alarm set           Time: [id number] PT Time Calculation (min) (ACUTE ONLY): 40 min   Charges:         PT G Codes:        Rocky Rishel 05/02/2016, 11:00 AM

## 2016-05-02 NOTE — Care Management Note (Signed)
Case Management Note  Patient Details  Name: Edwin Reed MRN: [id number] Date of Birth: Apr 03, 1956  Subjective/Objective:   Met with patient at bedside. He lives at home with his spouse. He will be discharged home on Aspirin. PT recommending HHPT. Patient prefers [facility]. He has a walker at home.PCP is Jeraldine Loots.                    Action/Plan: Referral to [facility].   Expected Discharge Date:                  Expected Discharge Plan:  [place]  In-House Referral:     Discharge planning Services  CM Consult  Post Acute Care Choice:  Home Health Choice offered to:  Patient  DME Arranged:    DME Agency:     HH Arranged:  PT [place]:  [place]  Status of Service:  In process, will continue to follow  If discussed at Long Length of Stay Meetings, dates discussed:    Additional Comments:  Jolly Mango, RN 05/02/2016, 9:55 AM

## 2016-05-02 NOTE — Progress Notes (Signed)
Subjective: 1 Day Post-Op Procedure(s) (LRB): TOTAL KNEE ARTHROPLASTY (Left)    Patient reports pain as mild.  Slept well.  Drain removed.  OOB in chair and comfortable.   Objective:   VITALS:   Vitals:   05/02/16 0838 05/02/16 1120  BP: 125/67 (!) 124/59  Pulse: 71 69  Resp: 16 20  Temp: 97.9 F (36.6 C) 99.3 F (37.4 C)    Neurologically intact ABD soft Neurovascular intact Sensation intact distally Intact pulses distally Dorsiflexion/Plantar flexion intact Incision: no drainage  LABS  Recent Labs  05/01/16 1304 05/02/16 0526  HGB 11.6* 11.3*  HCT 34.3* 32.9*  WBC 6.4 8.9  PLT 210 179     Recent Labs  05/01/16 1304 05/02/16 0526  NA  --  134*  K  --  4.2  BUN  --  19  CREATININE 1.20 1.38*  GLUCOSE  --  236*    No results for input(s): LABPT, INR in the last 72 hours.   Assessment/Plan: 1 Day Post-Op Procedure(s) (LRB): TOTAL KNEE ARTHROPLASTY (Left)   [facility] diet Up with therapy D/C IV fluids Discharge home with home health

## 2016-05-02 NOTE — Progress Notes (Signed)
Inpatient Diabetes Program Recommendations  AACE/ADA: New Consensus Statement on Inpatient Glycemic Control (2015)  Target Ranges:  Prepandial:   less than 140 mg/dL      Peak postprandial:   less than 180 mg/dL (1-2 hours)      Critically ill patients:  140 - 180 mg/dL  Results for Edwin PettyURNER, Oaklyn V (MRN [id number]) as of 05/02/2016 14:09  Ref. Range 05/01/2016 16:45 05/01/2016 22:12 05/02/2016 08:39 05/02/2016 08:53 05/02/2016 11:17  Glucose-Capillary Latest Ref Range: 65 - 99 mg/dL 119197 (H) 147186 (H) 829226 (H) 227 (H) 248 (H)    Review of Glycemic Control  Diabetes history: DM2 Outpatient Diabetes medications: 70/30 30 units QAM, 70/30 10 units QPM, Metformin XR 500 mg BID, Victoza 1.8 mg QAM Current orders for Inpatient glycemic control: 70/30 30 units QAM,  Metformin XR 500 mg BID, Victoza 1.8 mg QAM   Inpatient Diabetes Program Recommendations: Correction (SSI): While inpatient, please consider ordering CBGs with Novolog correction scale ACHS.  Thanks, Orlando PennerMarie Duane Earnshaw, RN, MSN, CDE Diabetes Coordinator Inpatient Diabetes Program [phone] (Team Pager from 8am to 5pm)

## 2016-05-03 LAB — GLUCOSE, CAPILLARY
Glucose-Capillary: 256 mg/dL — ABNORMAL HIGH (ref 65–99)
Glucose-Capillary: 217 mg/dL — ABNORMAL HIGH (ref 65–99)

## 2016-05-03 LAB — CBC
HEMATOCRIT: 30.1 % — AB (ref 40.0–52.0)
Hemoglobin: 10.3 g/dL — ABNORMAL LOW (ref 13.0–18.0)
MCH: 31.5 pg (ref 26.0–34.0)
MCHC: 34.2 g/dL (ref 32.0–36.0)
MCV: 92.1 fL (ref 80.0–100.0)
Platelets: 162 10*3/uL (ref 150–440)
RBC: 3.27 MIL/uL — ABNORMAL LOW (ref 4.40–5.90)
RDW: 13.8 % (ref 11.5–14.5)
WBC: 8.4 10*3/uL (ref 3.8–10.6)

## 2016-05-03 MED ORDER — HYDROCODONE-ACETAMINOPHEN 7.5-325 MG PO TABS
1.0000 | ORAL_TABLET | Freq: Four times a day (QID) | ORAL | 0 refills | Status: DC | PRN
Start: 2016-05-03 — End: 2016-07-12

## 2016-05-03 MED ORDER — ASPIRIN EC 325 MG PO TBEC: 325.0000 mg | DELAYED_RELEASE_TABLET | Freq: Two times a day (BID) | ORAL | 0 refills | Status: DC

## 2016-05-03 MED ORDER — GABAPENTIN 400 MG PO CAPS: 400.0000 mg | ORAL_CAPSULE | Freq: Two times a day (BID) | ORAL | 3 refills | Status: DC

## 2016-05-03 NOTE — Progress Notes (Signed)
Inpatient Diabetes Program Recommendations  AACE/ADA: New Consensus Statement on Inpatient Glycemic Control (2015)  Target Ranges:  Prepandial:   less than 140 mg/dL      Peak postprandial:   less than 180 mg/dL (1-2 hours)      Critically ill patients:  140 - 180 mg/dL   Lab Results  Component Value Date   GLUCAP 256 (H) 05/03/2016    Review of Glycemic Control   Results for Rennis PettyURNER, Jt V (MRN [id number]) as of 05/03/2016 09:03  Ref. Range 05/02/2016 08:53 05/02/2016 11:17 05/02/2016 17:04 05/02/2016 21:14 05/03/2016 07:55  Glucose-Capillary Latest Ref Range: 65 - 99 mg/dL 782227 (H) 956248 (H) 213208 (H) 228 (H) 256 (H)    Diabetes history: DM2 Outpatient Diabetes medications: 70/30 30 units QAM, 70/30 10 units QPM, Metformin XR 500 mg BID, Victoza 1.8 mg QAM  Current orders for Inpatient glycemic control: 70/30 30 units QAM,  Metformin XR 500 mg BID, Victoza 1.8 mg QAM   Inpatient Diabetes Program Recommendations: Please consider ordering Novolog moderate correction scale 0-15 units tid and Novolog 0-5 units qhs .  Susette RacerJulie Raffaele Derise, RN, BA, MHA, CDE Diabetes Coordinator Inpatient Diabetes Program  [phone] (Team Pager) [phone] [facility] Office) 05/03/2016 9:05 AM

## 2016-05-03 NOTE — Care Management (Signed)
Case discussed with Dr. Hyacinth MeekerMiller. He states patient has no need hor home PT. Patient to follow up with OP PT.

## 2016-05-03 NOTE — Discharge Summary (Signed)
Physician Discharge Summary  Patient ID: Edwin Reed MRN: [id number] DOB/AGE: 1955-09-14 61 y.o.  Admit date: 05/01/2016 Discharge date: 05/03/2016  Admission Diagnoses:  Discharge Diagnoses:  Active Problems:   Total knee replacement status   Discharged Condition: good  Hospital Course: Patient underwent cemented left total knee replacement on 05/01/2016.  He did well postoperatively with minimal pain.  He remained afebrile.  He made good progress with therapy.  Was ready for discharge home today.  Scheduled for outpatient PT in my office next week.  He will take Norco for pain, Celebrex 200 mg daily, gabapentin 400 mg twice a day, and enteric-coated aspirin 325 mg twice a day for 6 weeks.  He will return to my clinic for exam in 7-10 days.  Consults: None  Significant Diagnostic Studies: radiology: X-Ray: Satisfactory left total knee replacement  Treatments: therapies: PT  Discharge Exam: Blood pressure (!) 126/56, pulse 71, temperature 98.6 F (37 C), temperature source Oral, resp. rate 20, SpO2 100 %. Extremities: Homans sign is negative, no sign of DVT, no edema, redness or tenderness in the calves or thighs and no ulcers, gangrene or trophic changes is able to straight leg raise.  He is ambulatory with a walker.  Disposition: 01-Home or Self Care  Discharge Instructions    Call MD for:  persistant nausea and vomiting    Complete by:  As directed    Call MD for:  severe uncontrolled pain    Complete by:  As directed    Call MD for:  temperature >100.4    Complete by:  As directed    Diet - low sodium heart healthy    Complete by:  As directed    Discharge instructions    Complete by:  As directed    Work on range of motion of knee every 2-3 hours Partial weight left leg with walker Start outpatient PT next week as scheduled Return to clinic 7-10 days   Increase activity slowly    Complete by:  As directed      Allergies as of 05/03/2016   No Known Allergies      Medication List    STOP taking these medications   aspirin 81 MG tablet Replaced by:  aspirin EC 325 MG tablet   ibuprofen 200 MG tablet Commonly known as:  ADVIL,MOTRIN     TAKE these medications   amLODipine 10 MG tablet Commonly known as:  NORVASC Take 10 mg by mouth.   aspirin EC 325 MG tablet Take 1 tablet (325 mg total) by mouth 2 (two) times daily. Replaces:  aspirin 81 MG tablet   celecoxib 200 MG capsule Commonly known as:  CELEBREX Take 200 mg by mouth daily.   enalapril 20 MG tablet Commonly known as:  VASOTEC Take 20 mg by mouth daily.   fluticasone 50 MCG/ACT nasal spray Commonly known as:  FLONASE 2 sprays by Each Nare route daily as needed for allergies   gabapentin 400 MG capsule Commonly known as:  NEURONTIN Take 1 capsule (400 mg total) by mouth 2 (two) times daily.   glucose blood test strip   ACCU-CHEK AVIVA PLUS test strip Generic drug:  glucose blood TEST BLOOD SUGAR QD UTD   hydrochlorothiazide 25 MG tablet Commonly known as:  HYDRODIURIL Take 25 mg by mouth daily.   HYDROcodone-acetaminophen 7.5-325 MG tablet Commonly known as:  NORCO Take 1 tablet by mouth every 6 (six) hours as needed for moderate pain.   insulin NPH-regular Human (70-30) 100  UNIT/ML injection Commonly known as:  NOVOLIN 70/30 Inject 10-30 Units into the skin 2 (two) times daily. Inject 30 units in the morning and 10 units at night   LIPITOR 80 MG tablet Generic drug:  atorvastatin Take 80 mg by mouth every evening.   metFORMIN 500 MG 24 hr tablet Commonly known as:  GLUCOPHAGE-XR Take 500 mg by mouth 2 (two) times daily.   metoprolol 50 MG tablet Commonly known as:  LOPRESSOR Take 75 mg by mouth 2 (two) times daily.   multivitamin with minerals Tabs tablet Take 1 tablet by mouth daily.   VIAGRA 100 MG tablet Generic drug:  sildenafil Take 100 mg by mouth as needed for erectile dysfunction.   VICTOZA 18 MG/3ML Sopn Generic drug:   liraglutide Inject 1.8 mg into the skin every morning.            Durable Medical Equipment        Start     Ordered   05/01/16 1257  DME Walker rolling  Once    Question:  Patient needs a walker to treat with the following condition  Answer:  Total knee replacement status   05/01/16 1256       Signed: Valinda Hoar 05/03/2016, 1:13 PM

## 2016-05-03 NOTE — Plan of Care (Signed)
Problem: Bowel/Gastric: Goal: Will not experience complications related to bowel motility Outcome: Completed/Met Date Met: 05/03/16 Pt has met all goals for discharge.

## 2016-05-03 NOTE — Progress Notes (Signed)
Physical Therapy Treatment Patient Details Name: Edwin PettyWillie V Rubano MRN: [id number] DOB: [birth year] Today's Date: 05/03/2016    History of Present Illness Pt is pleasant 61 yo male s/p L TKR, has history of diabetes, underwent blood transfusion 05/01/16    PT Comments    Pt willing to participate in PT treatment. Demonstrated ability to safely transfer from supine to standing with min guarding. Pt ambulated with slow step to gait pattern due to pain and Wbing status, gait speed improved near end of ambulation. RW was also lowered during ambulation to improve overall gait posture and decrease the work of pt's UE . Pt ambulated 2 steps safely and maintain Wbing precautions while using a RW. Required PT demonstration, frequent verbal cues and min guarding during stair ambulation. Pt displayed a decrease in knee flex ROM , possibly due to knee immobilizer. Pt was provided with HEP packet and was educated on exercises as well as duration and frequency. Pt is safe to dc home and participate in basic household ambulation; still displays balance, strength and ROM deficits and will benefit from skilled PT ; recommend HHPT upon dc from hospital.   Follow Up Recommendations  Home health PT     Equipment Recommendations       Recommendations for Other Services       Precautions / Restrictions Precautions Precautions: Fall Required Braces or Orthoses: Knee Immobilizer - Left Knee Immobilizer - Left: On when out of bed or walking Restrictions Weight Bearing Restrictions: Yes LLE Weight Bearing: Partial weight bearing LLE Partial Weight Bearing Percentage or Pounds: 50% Other Position/Activity Restrictions: LLE PWB    Mobility  Bed Mobility Overal bed mobility: Needs Assistance Bed Mobility: Supine to Sit     Supine to sit: Supervision     General bed mobility comments: Pt able to pull himself to sitting position with UEs and able to rotate hips to obtain sitting at EOB  Transfers Overall  transfer level: Needs assistance Equipment used: Rolling walker (2 wheeled) Transfers: Sit to/from Stand Sit to Stand: Min guard         General transfer comment: L LE positioned anteriorly, utilizes UE and R LE to transfer to standing, requires RW for stance  Ambulation/Gait Ambulation/Gait assistance: Min guard Ambulation Distance (Feet): 150 Feet Assistive device: Rolling walker (2 wheeled) Gait Pattern/deviations: Step-to pattern;Decreased step length - right;Decreased stance time - left;Trunk flexed     General Gait Details: Pt's walker lowered to improve energy conservation of UEs, pt displayed slow gait speed that improved during ambulation, required verbal cues to maintain more upright posture in RW   Stairs Stairs: Yes   Stair Management: No rails Number of Stairs: 2 General stair comments: pt able to safely ambulate stairs with RW using backwards approach to maintain PWB, PT provided demonstration and verbal cues, pt requires assistance with guarding the walker when ascending and descending  Wheelchair Mobility    Modified Rankin (Stroke Patients Only)       Balance Overall balance assessment: Needs assistance Sitting-balance support: No upper extremity supported Sitting balance-Leahy Scale: Normal Sitting balance - Comments: Able to maintain upright sitting posture w/o UE support   Standing balance support: Bilateral upper extremity supported Standing balance-Leahy Scale: Good Standing balance comment: Requires RW for additional stability                     Cognition Arousal/Alertness: Awake/alert Behavior During Therapy: WFL for tasks assessed/performed Overall Cognitive Status: Within Functional Limits for tasks assessed  Exercises Total Joint Exercises Goniometric ROM: 4-86 act assist ROM Other Exercises Other Exercises: supine therex, 1x12 AROM, Ankle pumps, SLR and hip abd, min assist for hip abd and SLR, to  improve blood flow and functional strength Other Exercises: sitting therex AROM 1x10, Quad sets and heel slides to improve knee ROM, performed under supervision    General Comments        Pertinent Vitals/Pain Pain Assessment: 0-10 Pain Score: 7  Pain Location: L knee Pain Descriptors / Indicators: Aching Pain Intervention(s): Monitored during session;Premedicated before session    Home Living                      Prior Function            PT Goals (current goals can now be found in the care plan section) Acute Rehab PT Goals Patient Stated Goal: Return home PT Goal Formulation: With patient Time For Goal Achievement: 05/16/16 Potential to Achieve Goals: Good Progress towards PT goals: Progressing toward goals    Frequency    BID      PT Plan Current plan remains appropriate    Co-evaluation             End of Session Equipment Utilized During Treatment: Gait belt;Left knee immobilizer Activity Tolerance: Patient tolerated treatment well Patient left: in chair;with call bell/phone within reach;with family/visitor present;with chair alarm set     Time: 1610-9604 PT Time Calculation (min) (ACUTE ONLY): 57 min  Charges:                       G Codes:      Ilaisaane Marts 05/28/2016, 11:16 AM

## 2016-05-03 NOTE — Progress Notes (Addendum)
Shift assessment completed at 0810. Pt awake, alert and oriented, anticipating discharge today. L knee has polar care in place with knee immobilizer and dressing, cap refill to toes is wnl, pt is able to move his foot, rated pain at low level at this time. Pt is on room air, lungs clear bilat, Hr is regular, abdomen is soft, bs heard.Rppp. PIV #20 intact to rac, site free of redness and swelling. Pt worked successfully with Pt, Dr. Hyacinth MeekerMiller rounded on pt this afternoon and completed pt's discharge. This Clinical research associatewriter and Becky SaxJaime,RN changed pt's dressing to l knee and pt was given material for a dressing change at home. Pt received three scripts and discharge instructions, [facility]. PIV removed from pt's r amr with catheter intact by this writer prior to discharge.

## 2016-05-03 NOTE — Progress Notes (Signed)
Subjective: 2 Days Post-Op Procedure(s) (LRB): TOTAL KNEE ARTHROPLASTY (Left)    Patient reports pain as mild.  Objective:   VITALS:   Vitals:   05/03/16 0752 05/03/16 1125  BP: 127/60 (!) 126/56  Pulse: 68 71  Resp: 16 20  Temp: 98.6 F (37 C) 98.6 F (37 C)    Neurologically intact ABD soft Neurovascular intact Sensation intact distally Intact pulses distally Dorsiflexion/Plantar flexion intact Incision: no drainage  LABS  Recent Labs  05/01/16 1304 05/02/16 0526 05/03/16 0537  HGB 11.6* 11.3* 10.3*  HCT 34.3* 32.9* 30.1*  WBC 6.4 8.9 8.4  PLT 210 179 162     Recent Labs  05/01/16 1304 05/02/16 0526  NA  --  134*  K  --  4.2  BUN  --  19  CREATININE 1.20 1.38*  GLUCOSE  --  236*    No results for input(s): LABPT, INR in the last 72 hours.   Assessment/Plan: 2 Days Post-Op Procedure(s) (LRB): TOTAL KNEE ARTHROPLASTY (Left)   D/C today Has appt for OPPT next week my office

## 2016-07-14 ENCOUNTER — Other Ambulatory Visit: Payer: Self-pay | Admitting: Specialist

## 2016-07-17 ENCOUNTER — Encounter
Admission: RE | Admit: 2016-07-17 | Discharge: 2016-07-17 | Disposition: A | Discharge status: Home / Self Care | Payer: 59 | Source: Ambulatory Visit | Attending: Specialist | Admitting: Specialist

## 2016-07-17 ENCOUNTER — Other Ambulatory Visit: Payer: 59

## 2016-07-17 DIAGNOSIS — Z01812 Encounter for preprocedural laboratory examination: Secondary | ICD-10-CM | POA: Insufficient documentation

## 2016-07-17 NOTE — Pre-Procedure Instructions (Signed)
ECG Results - in this encounter   ECG 12 Lead (03/04/2016) ECG 12 Lead (03/04/2016)  Narrative  Normal sinus rhythm at 67 bpm.Normal axis and intervals.Normal R wave progression.No acute ST or T-wave changes.       Visit Diagnoses

## 2016-07-17 NOTE — Patient Instructions (Addendum)
  Your procedure is scheduled on: 07-24-16 (Wednesday)  Report to Same Day Surgery 2nd floor medical mall [facility](Medical Mall Entrance-take elevator on left to 2nd floor.  Check in with surgery information desk.) To find out your arrival time please call [phone] between 1PM - 3PM on 07-23-16 (Tuesday)  Remember: Instructions that are not followed completely may result in serious medical risk, up to and including death, or upon the discretion of your surgeon and anesthesiologist your surgery may need to be rescheduled.    _x___ 1. Do not eat food or drink liquids after midnight. No gum chewing or hard candies.     __x__ 2. No Alcohol for 24 hours before or after surgery.   __x__3. No Smoking for 24 prior to surgery.   ____  4. Bring all medications with you on the day of surgery if instructed.    __x__ 5. Notify your doctor if there is any change in your medical condition     (cold, fever, infections).     Do not wear jewelry, make-up, hairpins, clips or nail polish.  Do not wear lotions, powders, or perfumes. You may wear deodorant.  Do not shave 48 hours prior to surgery. Men may shave face and neck.  Do not bring valuables to the hospital.    [facility] is not responsible for any belongings or valuables.               Contacts, dentures or bridgework may not be worn into surgery.  Leave your suitcase in the car. After surgery it may be brought to your room.  For patients admitted to the hospital, discharge time is determined by your treatment team.   Patients discharged the day of surgery will not be allowed to drive home.  You will need someone to drive you home and stay with you the night of your procedure.    Please read over the following fact sheets that you were given:    _x___ Take anti-hypertensive (unless it includes a diuretic), cardiac, seizure, asthma,     anti-reflux and psychiatric medicines. These include:  1. NORVASC (AMLODIPINE)  2. VASOTEC (ENALAPRIL)  3.  GABAPENTIN (NEURONTIN)  4. METOPROLOL  5.  6.  ____Fleets enema or Magnesium Citrate as directed.   _x___ Use CHG Soap or sage wipes as directed on instruction sheet   ____ Use inhalers on the day of surgery and bring to hospital day of surgery  __X__ Stop Metformin and Janumet 2 days prior to surgery-LAST DOSE OF METFORMIN ON Sunday, April 1ST    _X___ Take 1/2 of usual insulin dose the night before surgery and none on the morning surgery-NO INSULIN AM OF SURGERY  _x___ Follow recommendations from Cardiologist, Pulmonologist or PCP regarding stopping Aspirin, Coumadin, Pllavix ,Eliquis, Effient, or Pradaxa, and Pletal-PT STOPPED ASPIRIN THIS WEEK  X____Stop Anti-inflammatories such as Advil, Aleve, Ibuprofen, Motrin, Naproxen, Naprosyn, Goodies powders or aspirin products NOW-OK to take Tylenol/CELEBREX   ____ Stop supplements until after surgery.     ____ Bring C-Pap to the hospital.

## 2016-07-18 ENCOUNTER — Encounter
Admission: RE | Admit: 2016-07-18 | Discharge: 2016-07-18 | Disposition: A | Discharge status: Home / Self Care | Payer: 59 | Source: Ambulatory Visit | Attending: Specialist | Admitting: Specialist

## 2016-07-18 DIAGNOSIS — Z01812 Encounter for preprocedural laboratory examination: Secondary | ICD-10-CM | POA: Diagnosis not present

## 2016-07-18 LAB — CBC
HCT: 33.8 % — ABNORMAL LOW (ref 40.0–52.0)
Hemoglobin: 11.6 g/dL — ABNORMAL LOW (ref 13.0–18.0)
MCH: 30.5 pg (ref 26.0–34.0)
MCHC: 34.3 g/dL (ref 32.0–36.0)
MCV: 89 fL (ref 80.0–100.0)
Platelets: 205 10*3/uL (ref 150–440)
RBC: 3.8 MIL/uL — AB (ref 4.40–5.90)
RDW: 15.5 % — ABNORMAL HIGH (ref 11.5–14.5)
WBC: 5.6 10*3/uL (ref 3.8–10.6)

## 2016-07-18 LAB — BASIC METABOLIC PANEL
ANION GAP: 9 (ref 5–15)
BUN: 22 mg/dL — ABNORMAL HIGH (ref 6–20)
CALCIUM: 9 mg/dL (ref 8.9–10.3)
CHLORIDE: 107 mmol/L (ref 101–111)
CO2: 22 mmol/L (ref 22–32)
Creatinine, Ser: 1.24 mg/dL (ref 0.61–1.24)
GFR calc non Af Amer: >60 mL/min (ref >60)
Glucose, Bld: 131 mg/dL — ABNORMAL HIGH (ref 65–99)
Potassium: 4.1 mmol/L (ref 3.5–5.1)
Sodium: 138 mmol/L (ref 135–145)

## 2016-07-18 LAB — SURGICAL PCR SCREEN
MRSA, PCR: NEGATIVE
Staphylococcus aureus: NEGATIVE

## 2016-07-19 LAB — TYPE AND SCREEN
ABO/RH(D): A POS
Antibody Screen: NEGATIVE

## 2016-07-23 MED ORDER — CEFAZOLIN SODIUM-DEXTROSE 2-4 GM/100ML-% IV SOLN
2.0000 g | INTRAVENOUS | Status: AC
  Administered 2016-07-24: 2 g via INTRAVENOUS

## 2016-07-23 MED ORDER — TRANEXAMIC ACID 1000 MG/10ML IV SOLN
15.0000 mg/kg | INTRAVENOUS | Status: AC
  Administered 2016-07-24: 1838 mg via INTRAVENOUS
  Filled 2016-07-23: qty 18.38

## 2016-07-23 MED ORDER — SODIUM CHLORIDE 0.9 % IV SOLN
1500.0000 mg | INTRAVENOUS | Status: AC
  Administered 2016-07-24: 1500 mg via INTRAVENOUS
  Filled 2016-07-23: qty 1500

## 2016-07-24 ENCOUNTER — Inpatient Hospital Stay: Payer: 59

## 2016-07-24 ENCOUNTER — Inpatient Hospital Stay: Payer: 59 | Admitting: Anesthesiology

## 2016-07-24 ENCOUNTER — Inpatient Hospital Stay
Admission: RE | Admit: 2016-07-24 | Discharge: 2016-07-26 | DRG: 470 | Disposition: A | Discharge status: Home / Self Care | Payer: 59 | Source: Ambulatory Visit | Attending: Specialist | Admitting: Specialist

## 2016-07-24 ENCOUNTER — Encounter
Admission: RE | Disposition: A | Discharge status: Home / Self Care | Payer: Self-pay | Source: Ambulatory Visit | Attending: Specialist

## 2016-07-24 DIAGNOSIS — E119 Type 2 diabetes mellitus without complications: Secondary | ICD-10-CM | POA: Diagnosis present

## 2016-07-24 DIAGNOSIS — Z79899 Other long term (current) drug therapy: Secondary | ICD-10-CM | POA: Diagnosis not present

## 2016-07-24 DIAGNOSIS — Z6835 Body mass index (BMI) 35.0-35.9, adult: Secondary | ICD-10-CM

## 2016-07-24 DIAGNOSIS — Z87891 Personal history of nicotine dependence: Secondary | ICD-10-CM

## 2016-07-24 DIAGNOSIS — E669 Obesity, unspecified: Secondary | ICD-10-CM | POA: Diagnosis present

## 2016-07-24 DIAGNOSIS — Z419 Encounter for procedure for purposes other than remedying health state, unspecified: Secondary | ICD-10-CM

## 2016-07-24 DIAGNOSIS — Z96659 Presence of unspecified artificial knee joint: Secondary | ICD-10-CM

## 2016-07-24 DIAGNOSIS — M1711 Unilateral primary osteoarthritis, right knee: Principal | ICD-10-CM | POA: Diagnosis present

## 2016-07-24 DIAGNOSIS — I10 Essential (primary) hypertension: Secondary | ICD-10-CM | POA: Diagnosis present

## 2016-07-24 HISTORY — PX: TOTAL KNEE ARTHROPLASTY: SHX125

## 2016-07-24 LAB — CBC
HEMATOCRIT: 35.5 % — AB (ref 40.0–52.0)
Hemoglobin: 11.7 g/dL — ABNORMAL LOW (ref 13.0–18.0)
MCH: 30.1 pg (ref 26.0–34.0)
MCHC: 33 g/dL (ref 32.0–36.0)
MCV: 91.4 fL (ref 80.0–100.0)
Platelets: 203 10*3/uL (ref 150–440)
RBC: 3.88 MIL/uL — ABNORMAL LOW (ref 4.40–5.90)
RDW: 15.8 % — AB (ref 11.5–14.5)
WBC: 4.9 10*3/uL (ref 3.8–10.6)

## 2016-07-24 LAB — GLUCOSE, CAPILLARY
Glucose-Capillary: 186 mg/dL — ABNORMAL HIGH (ref 65–99)
Glucose-Capillary: 172 mg/dL — ABNORMAL HIGH (ref 65–99)
Glucose-Capillary: 254 mg/dL — ABNORMAL HIGH (ref 65–99)
Glucose-Capillary: 221 mg/dL — ABNORMAL HIGH (ref 65–99)

## 2016-07-24 LAB — CREATININE, SERUM
CREATININE: 1.14 mg/dL (ref 0.61–1.24)
GFR calc non Af Amer: >60 mL/min (ref >60)

## 2016-07-24 SURGERY — ARTHROPLASTY, KNEE, TOTAL
Anesthesia: Spinal | Site: Knee | Laterality: Right | Wound class: Clean

## 2016-07-24 MED ORDER — ONDANSETRON HCL 4 MG/2ML IJ SOLN: 4.0000 mg | Freq: Once | INTRAMUSCULAR | Status: DC | PRN

## 2016-07-24 MED ORDER — ATORVASTATIN CALCIUM 20 MG PO TABS
80.0000 mg | ORAL_TABLET | Freq: Every evening | ORAL | Status: DC
  Administered 2016-07-24 – 2016-07-25 (×2): 80 mg via ORAL
  Filled 2016-07-24 (×2): qty 4

## 2016-07-24 MED ORDER — FENTANYL CITRATE (PF) 100 MCG/2ML IJ SOLN
INTRAMUSCULAR | Status: AC
  Filled 2016-07-24: qty 2

## 2016-07-24 MED ORDER — INSULIN ASPART PROT & ASPART (70-30 MIX) 100 UNIT/ML ~~LOC~~ SUSP
30.0000 [IU] | Freq: Every day | SUBCUTANEOUS | Status: DC
  Administered 2016-07-25 – 2016-07-26 (×2): 30 [IU] via SUBCUTANEOUS
  Filled 2016-07-24 (×2): qty 30

## 2016-07-24 MED ORDER — METOCLOPRAMIDE HCL 5 MG/ML IJ SOLN: 5.0000 mg | Freq: Three times a day (TID) | INTRAMUSCULAR | Status: DC | PRN

## 2016-07-24 MED ORDER — ALUM & MAG HYDROXIDE-SIMETH 200-200-20 MG/5ML PO SUSP: 30.0000 mL | ORAL | Status: DC | PRN

## 2016-07-24 MED ORDER — LIDOCAINE HCL (PF) 2 % IJ SOLN
INTRAMUSCULAR | Status: AC
  Filled 2016-07-24: qty 2

## 2016-07-24 MED ORDER — CEFAZOLIN SODIUM-DEXTROSE 2-4 GM/100ML-% IV SOLN
INTRAVENOUS | Status: AC
  Filled 2016-07-24: qty 100

## 2016-07-24 MED ORDER — MORPHINE SULFATE (PF) 4 MG/ML IV SOLN
INTRAVENOUS | Status: DC | PRN
Start: 2016-07-24 — End: 2016-07-24
  Administered 2016-07-24: 4 mg via INTRAVENOUS

## 2016-07-24 MED ORDER — SODIUM CHLORIDE 0.9 % IV SOLN
1500.0000 mg | Freq: Two times a day (BID) | INTRAVENOUS | Status: AC
  Administered 2016-07-24 – 2016-07-25 (×2): 1500 mg via INTRAVENOUS
  Filled 2016-07-24 (×2): qty 1500

## 2016-07-24 MED ORDER — GABAPENTIN 400 MG PO CAPS
400.0000 mg | ORAL_CAPSULE | Freq: Two times a day (BID) | ORAL | Status: DC
  Administered 2016-07-24 – 2016-07-26 (×4): 400 mg via ORAL
  Filled 2016-07-24 (×5): qty 1

## 2016-07-24 MED ORDER — ACETAMINOPHEN 650 MG RE SUPP
650.0000 mg | Freq: Four times a day (QID) | RECTAL | Status: DC | PRN
Start: 2016-07-24 — End: 2016-07-26

## 2016-07-24 MED ORDER — MIDAZOLAM HCL 2 MG/2ML IJ SOLN
INTRAMUSCULAR | Status: AC
  Filled 2016-07-24: qty 2

## 2016-07-24 MED ORDER — SILDENAFIL CITRATE 100 MG PO TABS: 100.0000 mg | ORAL_TABLET | ORAL | Status: DC | PRN

## 2016-07-24 MED ORDER — CELECOXIB 200 MG PO CAPS
200.0000 mg | ORAL_CAPSULE | Freq: Two times a day (BID) | ORAL | Status: DC
  Administered 2016-07-24 – 2016-07-26 (×5): 200 mg via ORAL
  Filled 2016-07-24 (×5): qty 1

## 2016-07-24 MED ORDER — SODIUM CHLORIDE 0.9 % IV SOLN
INTRAVENOUS | Status: DC
  Administered 2016-07-24: 06:00:00 via INTRAVENOUS

## 2016-07-24 MED ORDER — PREGABALIN 75 MG PO CAPS: 75.0000 mg | ORAL_CAPSULE | ORAL | Status: DC

## 2016-07-24 MED ORDER — PROPOFOL 500 MG/50ML IV EMUL
INTRAVENOUS | Status: DC | PRN
  Administered 2016-07-24: 70 ug/kg/min via INTRAVENOUS

## 2016-07-24 MED ORDER — METHOCARBAMOL 500 MG PO TABS
500.0000 mg | ORAL_TABLET | Freq: Four times a day (QID) | ORAL | Status: DC | PRN
  Administered 2016-07-24: 500 mg via ORAL
  Filled 2016-07-24: qty 1

## 2016-07-24 MED ORDER — FERROUS SULFATE 325 (65 FE) MG PO TABS
325.0000 mg | ORAL_TABLET | Freq: Three times a day (TID) | ORAL | Status: DC
  Administered 2016-07-24 – 2016-07-26 (×6): 325 mg via ORAL
  Filled 2016-07-24 (×5): qty 1

## 2016-07-24 MED ORDER — OXYCODONE HCL 5 MG PO TABS
5.0000 mg | ORAL_TABLET | ORAL | Status: DC | PRN
  Administered 2016-07-24 – 2016-07-25 (×5): 10 mg via ORAL
  Administered 2016-07-26: 5 mg via ORAL
  Administered 2016-07-26: 10 mg via ORAL
  Filled 2016-07-24 (×7): qty 2

## 2016-07-24 MED ORDER — CELECOXIB 200 MG PO CAPS
400.0000 mg | ORAL_CAPSULE | ORAL | Status: AC
  Administered 2016-07-24: 400 mg via ORAL

## 2016-07-24 MED ORDER — MAGNESIUM HYDROXIDE 400 MG/5ML PO SUSP
30.0000 mL | Freq: Every day | ORAL | Status: DC | PRN
  Administered 2016-07-25: 30 mL via ORAL
  Filled 2016-07-24: qty 30

## 2016-07-24 MED ORDER — MORPHINE SULFATE (PF) 2 MG/ML IV SOLN: 2.0000 mg | INTRAVENOUS | Status: DC | PRN

## 2016-07-24 MED ORDER — PREGABALIN 75 MG PO CAPS
ORAL_CAPSULE | ORAL | Status: AC
  Filled 2016-07-24: qty 1

## 2016-07-24 MED ORDER — FLUTICASONE PROPIONATE 50 MCG/ACT NA SUSP
1.0000 | Freq: Every day | NASAL | Status: DC | PRN
  Filled 2016-07-24: qty 16

## 2016-07-24 MED ORDER — NEOMYCIN-POLYMYXIN B GU 40-200000 IR SOLN
Status: AC
  Filled 2016-07-24: qty 20

## 2016-07-24 MED ORDER — MORPHINE SULFATE (PF) 4 MG/ML IV SOLN
INTRAVENOUS | Status: AC
  Filled 2016-07-24: qty 1

## 2016-07-24 MED ORDER — AMLODIPINE BESYLATE 10 MG PO TABS
10.0000 mg | ORAL_TABLET | ORAL | Status: DC
  Administered 2016-07-25: 10 mg via ORAL
  Filled 2016-07-24 (×2): qty 1

## 2016-07-24 MED ORDER — GLYCOPYRROLATE 0.2 MG/ML IJ SOLN
INTRAMUSCULAR | Status: AC
  Filled 2016-07-24: qty 1

## 2016-07-24 MED ORDER — BUPIVACAINE HCL (PF) 0.25 % IJ SOLN
INTRAMUSCULAR | Status: AC
  Filled 2016-07-24: qty 60

## 2016-07-24 MED ORDER — ENALAPRIL MALEATE 10 MG PO TABS
20.0000 mg | ORAL_TABLET | ORAL | Status: DC
  Administered 2016-07-25: 20 mg via ORAL
  Filled 2016-07-24 (×2): qty 2

## 2016-07-24 MED ORDER — LIRAGLUTIDE 18 MG/3ML ~~LOC~~ SOPN: 1.8000 mg | PEN_INJECTOR | SUBCUTANEOUS | Status: DC

## 2016-07-24 MED ORDER — ONDANSETRON HCL 4 MG/2ML IJ SOLN: 4.0000 mg | Freq: Four times a day (QID) | INTRAMUSCULAR | Status: DC | PRN

## 2016-07-24 MED ORDER — BISACODYL 10 MG RE SUPP: 10.0000 mg | Freq: Every day | RECTAL | Status: DC | PRN

## 2016-07-24 MED ORDER — PROPOFOL 500 MG/50ML IV EMUL
INTRAVENOUS | Status: AC
  Filled 2016-07-24: qty 50

## 2016-07-24 MED ORDER — FLEET ENEMA 7-19 GM/118ML RE ENEM: 1.0000 | ENEMA | Freq: Once | RECTAL | Status: DC | PRN

## 2016-07-24 MED ORDER — MIDAZOLAM HCL 5 MG/5ML IJ SOLN
INTRAMUSCULAR | Status: DC | PRN
  Administered 2016-07-24: 2 mg via INTRAVENOUS

## 2016-07-24 MED ORDER — PHENYLEPHRINE HCL 10 MG/ML IJ SOLN
INTRAMUSCULAR | Status: DC | PRN
  Administered 2016-07-24: 18 ug/min via INTRAVENOUS

## 2016-07-24 MED ORDER — CELECOXIB 200 MG PO CAPS: 200.0000 mg | ORAL_CAPSULE | Freq: Every day | ORAL | Status: DC

## 2016-07-24 MED ORDER — KETAMINE HCL 50 MG/ML IJ SOLN
INTRAMUSCULAR | Status: DC | PRN
  Administered 2016-07-24: 25 mg via INTRAMUSCULAR
  Administered 2016-07-24: 1.8 mg via INTRAMUSCULAR
  Administered 2016-07-24: 3 mg via INTRAMUSCULAR

## 2016-07-24 MED ORDER — KETAMINE HCL 50 MG/ML IJ SOLN
INTRAMUSCULAR | Status: AC
  Filled 2016-07-24: qty 10

## 2016-07-24 MED ORDER — ACETAMINOPHEN 10 MG/ML IV SOLN
INTRAVENOUS | Status: AC
  Filled 2016-07-24: qty 100

## 2016-07-24 MED ORDER — ACETAMINOPHEN 325 MG PO TABS
650.0000 mg | ORAL_TABLET | Freq: Four times a day (QID) | ORAL | Status: DC | PRN
  Administered 2016-07-25 – 2016-07-26 (×2): 650 mg via ORAL
  Filled 2016-07-24: qty 2

## 2016-07-24 MED ORDER — GLYCOPYRROLATE 0.2 MG/ML IJ SOLN
INTRAMUSCULAR | Status: DC | PRN
Start: 2016-07-24 — End: 2016-07-24
  Administered 2016-07-24: 0.1 mg via INTRAVENOUS
  Administered 2016-07-24: 0.2 mg via INTRAVENOUS

## 2016-07-24 MED ORDER — METHOCARBAMOL 1000 MG/10ML IJ SOLN
500.0000 mg | Freq: Four times a day (QID) | INTRAMUSCULAR | Status: DC | PRN
  Filled 2016-07-24: qty 5

## 2016-07-24 MED ORDER — SODIUM CHLORIDE 0.9 % IV SOLN
INTRAVENOUS | Status: DC | PRN
  Administered 2016-07-24: 7 ug/kg/min via INTRAVENOUS

## 2016-07-24 MED ORDER — NEOMYCIN-POLYMYXIN B GU 40-200000 IR SOLN
Status: DC | PRN
  Administered 2016-07-24: 16 mL

## 2016-07-24 MED ORDER — ONDANSETRON HCL 4 MG PO TABS: 4.0000 mg | ORAL_TABLET | Freq: Four times a day (QID) | ORAL | Status: DC | PRN

## 2016-07-24 MED ORDER — TRANEXAMIC ACID 1000 MG/10ML IV SOLN
1000.0000 mg | Freq: Once | INTRAVENOUS | Status: AC
  Administered 2016-07-24: 1000 mg via INTRAVENOUS
  Filled 2016-07-24: qty 10

## 2016-07-24 MED ORDER — SODIUM CHLORIDE 0.45 % IV SOLN
INTRAVENOUS | Status: DC
  Administered 2016-07-25: 03:00:00 via INTRAVENOUS

## 2016-07-24 MED ORDER — SODIUM CHLORIDE 0.9 % IJ SOLN
INTRAMUSCULAR | Status: AC
  Filled 2016-07-24: qty 100

## 2016-07-24 MED ORDER — ENOXAPARIN SODIUM 30 MG/0.3ML ~~LOC~~ SOLN
30.0000 mg | SUBCUTANEOUS | Status: DC
  Administered 2016-07-25: 30 mg via SUBCUTANEOUS
  Filled 2016-07-24: qty 0.3

## 2016-07-24 MED ORDER — MENTHOL 3 MG MT LOZG
1.0000 | LOZENGE | OROMUCOSAL | Status: DC | PRN
  Filled 2016-07-24: qty 9

## 2016-07-24 MED ORDER — SODIUM CHLORIDE 0.9 % IV SOLN
INTRAVENOUS | Status: DC | PRN
  Administered 2016-07-24: 60 mL

## 2016-07-24 MED ORDER — ACETAMINOPHEN 500 MG PO TABS
1000.0000 mg | ORAL_TABLET | Freq: Four times a day (QID) | ORAL | Status: AC
  Administered 2016-07-24 – 2016-07-25 (×3): 1000 mg via ORAL
  Filled 2016-07-24 (×4): qty 2

## 2016-07-24 MED ORDER — CHLORHEXIDINE GLUCONATE CLOTH 2 % EX PADS: 6.0000 | MEDICATED_PAD | Freq: Once | CUTANEOUS | Status: DC

## 2016-07-24 MED ORDER — BUPIVACAINE HCL 0.25 % IJ SOLN
INTRAMUSCULAR | Status: DC | PRN
  Administered 2016-07-24: 30 mL

## 2016-07-24 MED ORDER — METOCLOPRAMIDE HCL 10 MG PO TABS: 5.0000 mg | ORAL_TABLET | Freq: Three times a day (TID) | ORAL | Status: DC | PRN

## 2016-07-24 MED ORDER — FENTANYL CITRATE (PF) 100 MCG/2ML IJ SOLN: 25.0000 ug | INTRAMUSCULAR | Status: DC | PRN

## 2016-07-24 MED ORDER — METFORMIN HCL ER 500 MG PO TB24
500.0000 mg | ORAL_TABLET | Freq: Two times a day (BID) | ORAL | Status: DC
  Administered 2016-07-24 – 2016-07-26 (×5): 500 mg via ORAL
  Filled 2016-07-24 (×5): qty 1

## 2016-07-24 MED ORDER — ADULT MULTIVITAMIN W/MINERALS CH
1.0000 | ORAL_TABLET | Freq: Every day | ORAL | Status: DC
  Administered 2016-07-25 – 2016-07-26 (×2): 1 via ORAL
  Filled 2016-07-24 (×2): qty 1

## 2016-07-24 MED ORDER — INSULIN ASPART PROT & ASPART (70-30 MIX) 100 UNIT/ML ~~LOC~~ SUSP
10.0000 [IU] | Freq: Every day | SUBCUTANEOUS | Status: DC
  Administered 2016-07-24 – 2016-07-25 (×2): 10 [IU] via SUBCUTANEOUS
  Filled 2016-07-24 (×2): qty 10

## 2016-07-24 MED ORDER — ACETAMINOPHEN 10 MG/ML IV SOLN
INTRAVENOUS | Status: DC | PRN
  Administered 2016-07-24: 1000 mg via INTRAVENOUS

## 2016-07-24 MED ORDER — KETOROLAC TROMETHAMINE 30 MG/ML IJ SOLN
INTRAMUSCULAR | Status: DC | PRN
  Administered 2016-07-24: 30 mg via INTRAVENOUS

## 2016-07-24 MED ORDER — HYDROCHLOROTHIAZIDE 25 MG PO TABS
25.0000 mg | ORAL_TABLET | Freq: Every day | ORAL | Status: DC
  Administered 2016-07-24 – 2016-07-26 (×3): 25 mg via ORAL
  Filled 2016-07-24 (×3): qty 1

## 2016-07-24 MED ORDER — FAMOTIDINE 20 MG PO TABS
20.0000 mg | ORAL_TABLET | Freq: Once | ORAL | Status: AC
  Administered 2016-07-24: 20 mg via ORAL

## 2016-07-24 MED ORDER — PHENOL 1.4 % MT LIQD
1.0000 | OROMUCOSAL | Status: DC | PRN
  Filled 2016-07-24: qty 177

## 2016-07-24 MED ORDER — FENTANYL CITRATE (PF) 100 MCG/2ML IJ SOLN
INTRAMUSCULAR | Status: DC | PRN
  Administered 2016-07-24: 50 ug via INTRAVENOUS

## 2016-07-24 MED ORDER — ASPIRIN EC 81 MG PO TBEC
81.0000 mg | DELAYED_RELEASE_TABLET | Freq: Every day | ORAL | Status: DC
  Administered 2016-07-24 – 2016-07-26 (×3): 81 mg via ORAL
  Filled 2016-07-24 (×3): qty 1

## 2016-07-24 MED ORDER — METOPROLOL TARTRATE 50 MG PO TABS
75.0000 mg | ORAL_TABLET | Freq: Two times a day (BID) | ORAL | Status: DC
  Administered 2016-07-24 – 2016-07-26 (×4): 75 mg via ORAL
  Filled 2016-07-24 (×5): qty 1

## 2016-07-24 MED ORDER — BUPIVACAINE IN DEXTROSE 0.75-8.25 % IT SOLN
INTRATHECAL | Status: DC | PRN
  Administered 2016-07-24: 1.6 mL via INTRATHECAL

## 2016-07-24 MED ORDER — PROPOFOL 10 MG/ML IV BOLUS
INTRAVENOUS | Status: DC | PRN
  Administered 2016-07-24: 18 mg via INTRAVENOUS
  Administered 2016-07-24: 30 mg via INTRAVENOUS

## 2016-07-24 MED ORDER — CELECOXIB 200 MG PO CAPS
ORAL_CAPSULE | ORAL | Status: AC
  Administered 2016-07-24: 400 mg via ORAL
  Filled 2016-07-24: qty 2

## 2016-07-24 MED ORDER — DIPHENHYDRAMINE HCL 12.5 MG/5ML PO ELIX: 12.5000 mg | ORAL_SOLUTION | ORAL | Status: DC | PRN

## 2016-07-24 MED ORDER — FAMOTIDINE 20 MG PO TABS
ORAL_TABLET | ORAL | Status: AC
  Administered 2016-07-24: 20 mg via ORAL
  Filled 2016-07-24: qty 1

## 2016-07-24 MED ORDER — BUPIVACAINE LIPOSOME 1.3 % IJ SUSP
INTRAMUSCULAR | Status: AC
  Filled 2016-07-24: qty 20

## 2016-07-24 MED ORDER — SENNA 8.6 MG PO TABS
1.0000 | ORAL_TABLET | Freq: Two times a day (BID) | ORAL | Status: DC
  Administered 2016-07-24 – 2016-07-26 (×5): 8.6 mg via ORAL
  Filled 2016-07-24 (×5): qty 1

## 2016-07-24 SURGICAL SUPPLY — 59 items
AUTOTRANSFUS HAS 1/8 (MISCELLANEOUS) ×3
BLADE DEBAKEY 8.0 (BLADE) ×2 IMPLANT
BLADE DEBAKEY 8.0MM (BLADE) ×1
BLADE SAGITTAL WIDE XTHICK NO (BLADE) ×3 IMPLANT
CANISTER SUCT 1200ML W/VALVE (MISCELLANEOUS) ×3 IMPLANT
CANISTER SUCT 3000ML (MISCELLANEOUS) ×3 IMPLANT
CAP KNEE TOTAL 3 SIGMA ×3 IMPLANT
CATH TRAY METER 16FR LF (MISCELLANEOUS) ×3 IMPLANT
CEMENT HV SMART SET (Cement) ×6 IMPLANT
CHLORAPREP W/TINT 26ML (MISCELLANEOUS) ×6 IMPLANT
COOLER POLAR GLACIER W/PUMP (MISCELLANEOUS) ×3 IMPLANT
CUFF TOURN 24 STER (MISCELLANEOUS) IMPLANT
CUFF TOURN 30 STER DUAL PORT (MISCELLANEOUS) IMPLANT
CUFF TOURN 34 STER (MISCELLANEOUS) ×3 IMPLANT
CUP MEDICINE 2OZ PLAST GRAD ST (MISCELLANEOUS) ×6 IMPLANT
DRAPE INCISE IOBAN 66X60 STRL (DRAPES) ×3 IMPLANT
DRAPE SHEET LG 3/4 BI-LAMINATE (DRAPES) ×3 IMPLANT
DRSG AQUACEL AG ADV 3.5X10 (GAUZE/BANDAGES/DRESSINGS) IMPLANT
DRSG AQUACEL AG ADV 3.5X14 (GAUZE/BANDAGES/DRESSINGS) ×3 IMPLANT
ELECT REM PT RETURN 9FT ADLT (ELECTROSURGICAL) ×3
ELECTRODE REM PT RTRN 9FT ADLT (ELECTROSURGICAL) ×1 IMPLANT
GLOVE BIO SURGEON STRL SZ8 (GLOVE) ×3 IMPLANT
GLOVE BIOGEL PI IND STRL 8.5 (GLOVE) ×2 IMPLANT
GLOVE BIOGEL PI INDICATOR 8.5 (GLOVE) ×4
GLOVE INDICATOR 8.0 STRL GRN (GLOVE) ×6 IMPLANT
GLOVE SURG ORTHO 8.0 STRL STRW (GLOVE) ×6 IMPLANT
GLOVE SURG ORTHO 8.5 STRL (GLOVE) ×6 IMPLANT
GOWN STRL REUS W/ TWL LRG LVL3 (GOWN DISPOSABLE) ×2 IMPLANT
GOWN STRL REUS W/TWL LRG LVL3 (GOWN DISPOSABLE) ×6
GOWN STRL REUS W/TWL LRG LVL4 (GOWN DISPOSABLE) ×6 IMPLANT
HANDPIECE INTERPULSE COAX TIP (DISPOSABLE) ×2
IMMBOLIZER KNEE 19 BLUE UNIV (SOFTGOODS) ×3 IMPLANT
KIT RM TURNOVER STRD PROC AR (KITS) ×3 IMPLANT
NEEDLE 18GX1X1/2 (RX/OR ONLY) (NEEDLE) ×3 IMPLANT
NEEDLE SPNL 20GX3.5 QUINCKE YW (NEEDLE) ×3 IMPLANT
NS IRRIG 1000ML POUR BTL (IV SOLUTION) ×3 IMPLANT
PACK TOTAL KNEE (MISCELLANEOUS) ×3 IMPLANT
PAD WRAPON POLAR KNEE (MISCELLANEOUS) ×1 IMPLANT
SET HNDPC FAN SPRY TIP SCT (DISPOSABLE) ×1 IMPLANT
SOL .9 NS 3000ML IRR  AL (IV SOLUTION) ×2
SOL .9 NS 3000ML IRR UROMATIC (IV SOLUTION) ×1 IMPLANT
SPONGE LAP 18X18 5 PK (GAUZE/BANDAGES/DRESSINGS) ×3 IMPLANT
STAPLER SKIN PROX 35W (STAPLE) ×3 IMPLANT
SUCTION FRAZIER HANDLE 10FR (MISCELLANEOUS) ×2
SUCTION TUBE FRAZIER 10FR DISP (MISCELLANEOUS) ×1 IMPLANT
SUT BONE WAX W31G (SUTURE) ×3 IMPLANT
SUT DVC 2 QUILL PDO  T11 36X36 (SUTURE) ×2
SUT DVC 2 QUILL PDO T11 36X36 (SUTURE) ×1 IMPLANT
SUT ORTHOCORD OS-6 NDL 36 (SUTURE) ×3 IMPLANT
SUT QUILL PDO 0 36 36 VIOLET (SUTURE) ×3 IMPLANT
SUT VIC AB 0 CT1 36 (SUTURE) ×3 IMPLANT
SUT VIC AB 2-0 CT1 (SUTURE) ×3 IMPLANT
SYR 20CC LL (SYRINGE) ×6 IMPLANT
SYR 50ML LL SCALE MARK (SYRINGE) ×6 IMPLANT
SYSTEM AUTOTRANSFUS DUAL TROCR (MISCELLANEOUS) ×1 IMPLANT
TAPE MICROFOAM 4IN (TAPE) ×3 IMPLANT
TOWER CARTRIDGE SMART MIX (DISPOSABLE) ×3 IMPLANT
TUBE SUCT KAM VAC (TUBING) ×3 IMPLANT
WRAPON POLAR PAD KNEE (MISCELLANEOUS) ×3

## 2016-07-24 NOTE — Anesthesia Preprocedure Evaluation (Signed)
Anesthesia Evaluation  Patient identified by MRN, date of birth, ID band Patient awake    Reviewed: Allergy & Precautions, NPO status , Patient's Chart, lab work & pertinent test results, reviewed documented beta blocker date and time   Airway Mallampati: III  TM Distance: >3 FB     Dental  (+) Chipped   Pulmonary former smoker,           Cardiovascular hypertension, Pt. on medications and Pt. on home beta blockers      Neuro/Psych    GI/Hepatic   Endo/Other  diabetes, Type 2  Renal/GU      Musculoskeletal  (+) Arthritis ,   Abdominal   Peds  Hematology   Anesthesia Other Findings Obese.  Reproductive/Obstetrics                             Anesthesia Physical Anesthesia Plan  ASA: III  Anesthesia Plan: Spinal   Post-op Pain Management:    Induction:   Airway Management Planned:   Additional Equipment:   Intra-op Plan:   Post-operative Plan:   Informed Consent: I have reviewed the patients History and Physical, chart, labs and discussed the procedure including the risks, benefits and alternatives for the proposed anesthesia with the patient or authorized representative who has indicated his/her understanding and acceptance.     Plan Discussed with: CRNA  Anesthesia Plan Comments:         Anesthesia Quick Evaluation

## 2016-07-24 NOTE — Anesthesia Procedure Notes (Signed)
Spinal  Patient location during procedure: OR Staffing Anesthesiologist: Jakeim Sedore Performed: anesthesiologist  Preanesthetic Checklist Completed: patient identified, site marked, surgical consent, pre-op evaluation, timeout performed, IV checked and risks and benefits discussed Spinal Block Patient position: sitting Prep: Betadine Patient monitoring: heart rate, cardiac monitor, continuous pulse ox and blood pressure Approach: midline Location: L3-4 Injection technique: single-shot Needle Needle type: Pencil-Tip  Needle gauge: 25 G Needle length: 9 cm Assessment Sensory level: T10 Additional Notes Marcaine 1.6ml.     

## 2016-07-24 NOTE — OR Nursing (Signed)
Clarified medications with Dr Hyacinth Meeker via phone order received to discontinue Lyrica.

## 2016-07-24 NOTE — Anesthesia Post-op Follow-up Note (Cosign Needed)
Anesthesia QCDR form completed.        

## 2016-07-24 NOTE — Evaluation (Signed)
Physical Therapy Evaluation Patient Details Name: Edwin Reed MRN: [id number] DOB: 1955-06-12 Today's Date: 07/24/2016   History of Present Illness  Pt is a 61 y.o. male s/p elective R TKA 07/24/16.  Pt seen for initial PT eval on POD #0.  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  Clinical Impression  Prior to hospital admission, pt was independent ambulating without AD.  Pt lives with his wife in 1 level home with steps to enter.  Currently pt is SBA supine to sit; min assist to stand; and CGA to min assist to take a few steps bed to chair with RW.  Pt requiring initial vc's for PWB'ing status.  Pt would benefit from skilled PT to address noted impairments and functional limitations.  Recommend pt discharge to home when medically appropriate.    Follow Up Recommendations Home health PT    Equipment Recommendations  Rolling walker with 5" wheels (pt reports already owning RW)    Recommendations for Other Services       Precautions / Restrictions Precautions Precautions: Knee;Fall Precaution Booklet Issued: Yes (comment) Required Braces or Orthoses: Knee Immobilizer - Right Knee Immobilizer - Right: On at all times Restrictions Weight Bearing Restrictions: Yes RLE Weight Bearing: Partial weight bearing      Mobility  Bed Mobility Overal bed mobility: Needs Assistance Bed Mobility: Supine to Sit     Supine to sit: Supervision;HOB elevated     General bed mobility comments: increased effort to perform; assist for lines/drains  Transfers Overall transfer level: Needs assistance Equipment used: Rolling walker (2 wheeled) Transfers: Sit to/from Stand Sit to Stand: Min assist         General transfer comment: assist to initiate stand; vc's for hand placement and technique  Ambulation/Gait Ambulation/Gait assistance: Min guard;Min assist Ambulation Distance (Feet): 3 Feet (bed to chair) Assistive device: Rolling walker (2 wheeled)   Gait velocity:  decreased   General Gait Details: decreased stance time R LE; step to pattern; vc's for gait technique/walker use and PWB'ing status  Stairs            Wheelchair Mobility    Modified Rankin (Stroke Patients Only)       Balance Overall balance assessment: Needs assistance Sitting-balance support: No upper extremity supported;Feet supported Sitting balance-Leahy Scale: Good     Standing balance support: Bilateral upper extremity supported;During functional activity (on RW) Standing balance-Leahy Scale: Good Standing balance comment: during transfer with walker                             Pertinent Vitals/Pain Pain Assessment: 0-10 Pain Score: 6  (8-9/10 beginning of session; 6/10 end of session) Pain Location: R knee Pain Descriptors / Indicators: Sore;Tender;Operative site guarding;Grimacing;Guarding Pain Intervention(s): Limited activity within patient's tolerance;Monitored during session;Repositioned;Patient requesting pain meds-RN notified;RN gave pain meds during session;Ice applied  Vitals (HR and O2 on room air) stable and WFL throughout treatment session.    Home Living Family/patient expects to be discharged to:: Private residence Living Arrangements: Spouse/significant other Available Help at Discharge: Family;Available PRN/intermittently Type of Home: House Home Access: Stairs to enter Entrance Stairs-Rails: None Entrance Stairs-Number of Steps: 2 Home Layout: One level Home Equipment: Cane - single point;Walker - 2 wheels      Prior Function Level of Independence: Independent         Comments: Independent with all ADL's/IADL's.  Not using any AD prior to this surgery.  Denies  any falls in past 6 months.     Hand Dominance   Dominant Hand: Right    Extremity/Trunk Assessment   Upper Extremity Assessment Upper Extremity Assessment: Overall WFL for tasks assessed    Lower Extremity Assessment Lower Extremity Assessment: RLE  deficits/detail (L LE WFL) RLE Deficits / Details: Able to perform R LE SLR independently; R knee flexion/extension AROM limited d/t pain; R DF at least 3/5 RLE: Unable to fully assess due to pain    Cervical / Trunk Assessment Cervical / Trunk Assessment: Normal  Communication   Communication: No difficulties  Cognition Arousal/Alertness: Awake/alert Behavior During Therapy: WFL for tasks assessed/performed Overall Cognitive Status: Within Functional Limits for tasks assessed                                        General Comments General comments (skin integrity, edema, etc.): polar care, hemovac, KI, and TENS unit in place.  Nursing cleared pt for participation in physical therapy.  Pt agreeable to PT session.  Pt's wife present during session.    Exercises Total Joint Exercises Ankle Circles/Pumps: AROM;Strengthening;Both;10 reps;Supine Quad Sets: AROM;Strengthening;Both;10 reps;Supine Short Arc Quad: AROM;Strengthening;Right;10 reps;Supine Heel Slides: AAROM;Strengthening;Right;10 reps;Supine Hip ABduction/ADduction: AAROM;Strengthening;Right;10 reps;Supine Straight Leg Raises: AROM;Strengthening;Both;10 reps;Supine Goniometric ROM: R knee extension 8 degrees short of neutral and R knee flexion 70 degrees semi-supine in bed   Assessment/Plan    PT Assessment Patient needs continued PT services  PT Problem List Decreased strength;Decreased range of motion;Decreased mobility;Pain       PT Treatment Interventions DME instruction;Gait training;Stair training;Functional mobility training;Therapeutic activities;Therapeutic exercise;Balance training;Patient/family education    PT Goals (Current goals can be found in the Care Plan section)  Acute Rehab PT Goals Patient Stated Goal: to go home PT Goal Formulation: With patient Time For Goal Achievement: 08/07/16 Potential to Achieve Goals: Good    Frequency BID   Barriers to discharge         Co-evaluation               End of Session Equipment Utilized During Treatment: Gait belt;Right knee immobilizer Activity Tolerance: Patient tolerated treatment well;No increased pain Patient left: in chair;with call bell/phone within reach;with chair alarm set;with family/visitor present;with SCD's reapplied (B heels elevated via towel rolls; polar care in place and activated) Nurse Communication: Mobility status;Precautions;Patient requests pain meds;Weight bearing status PT Visit Diagnosis: Difficulty in walking, not elsewhere classified (R26.2);Pain Pain - Right/Left: Right Pain - part of body: Knee    Time: 1610-9604 PT Time Calculation (min) (ACUTE ONLY): 47 min   Charges:   PT Evaluation $PT Eval Low Complexity: 1 Procedure PT Treatments $Therapeutic Exercise: 23-37 mins   PT G CodesHendricks Limes, PT 07/24/16, 5:31 PM [phone]

## 2016-07-24 NOTE — Op Note (Signed)
DATE OF SURGERY:  07/24/2016 TIME: 11:06 AM  PATIENT NAME:  Edwin Reed   AGE: 61 y.o.    PRE-OPERATIVE DIAGNOSIS:  M17.11 Unilateral primary osteoarthritis, right kne  POST-OPERATIVE DIAGNOSIS:  Same  PROCEDURE:  Procedure(s): TOTAL KNEE ARTHROPLASTY RIGHT KNEE,  DEPUY LCS ROTATING PLATFORM, KEELED, CEMENTED    SURGEON:  Kohl Polinsky E, MD   ASSISTANT: ALEX MELOY, PAC  OPERATIVE IMPLANTS: Depuy LCS Femur/Patella size LARGE, Tibia size #5,  Rotating platform polyethylene size 12.5 MM    Total tourniquet time was 140 minutes.  PREOPERATIVE INDICATIONS:  Edwin Reed is a 61 y.o. year old male with end stage bone on bone degenerative arthritis of the knee who failed conservative treatment, including injections, antiinflammatories, activity modification, and assistive devices, and had significant impairment of their activities of daily living, and elected for Total Knee Arthroplasty.   The risks, benefits, and alternatives were discussed at length including but not limited to the risks of infection, bleeding, nerve injury, stiffness, blood clots, the need for revision surgery, cardiopulmonary complications, among others, and they were willing to proceed.  OPERATIVE FINDINGS AND UNIQUE ASPECTS OF THE CASE:  SEVERE ARTHRITIS WITH VARUS  OPERATIVE DESCRIPTION:   The patient was brought to the operative room and placed in a supine position. Spinal anesthesia was administered. IV VANCOMYCIN antibiotics were given. The lower extremity was prepped and draped in the usual sterile fashion. Time out was performed. The leg was elevated and exsanguinated and the tourniquet was inflated to 350 mmHg  An anterior midline incision was made. Anterior quadriceps tendon splitting approach was performed. The patella was everted and osteophytes were removed. The anterior horn of the medial and lateral meniscus was removed. Then the extramedullary tibial cutting jig was utilized making the appropriate  cut using the anterior tibial crest as a reference building in appropriate posterior slope. Care was taken during the cut to protect the medial and collateral ligaments. The proximal tibia was removed along with the posterior horns of the menisci. The PCL was sacrificed.  The distal femur was sized as above . Medial release was carried out. The anterior femoral cutting guide was aligned and centering hole made. The rotation guide was inserted and the anterior cutting guide pinned in place, and was in excellent alignment. The posterior femoral cuts were made. The flexion gap was established. The distal femoral cutting guide was introduced at 4 of valgus. This was pinned and the distal femoral cut made. The extension gap was established and was stable. The finishing guide was applied and finishing cuts made. The Mchale retractor was inserted and the keeled tibial trial was pinned in place. Centering hole was made and the keel inserted. The femoral component was inserted along with a polyethylene  insert and the knee articulated.  Extension and flexion showed good stability throughout. The patella was then sized and cut made for the patellar component. Centering holes were made. The trial was inserted and the knee articulated nicely with no need for lateral release. The trials were all removed and the knee thoroughly irrigated with pulsed lavage. Exparil was injected. A portable lateral x-ray was taken to look for any metallic debris since a screw on the patellar cutting clamp had broken.  No metallic foreign bodies were seen.  The knee was dried and the cement mixed. The  keeled tibial component,  femoral component and patellar components were all cemented in place and excess cement was removed. The cement was allowed to harden for 10 minutes.  Further irrigation was carried out. Bone wax was applied to all raw bony surfaces. Autovac drains were inserted. Quarter percent plain Marcaine, Toradol and morphine were  injected. The capsule was closed with #2 Quill suture, and the subcutaneous tissues were closed with 0 Quill suture. The skin was closed with staples.Sponge and needle counts were correct.  Aquacel dressing with TENS pads and a dry sterile dressing were applied. Polar Care and knee immobilizer were applied. Tourniquet was deflated with excellent return of blood flow to foot. Patient was transferred to a hospital bed and taken to the recovery room in good condition.  Valinda Hoar, MD

## 2016-07-24 NOTE — H&P (Signed)
THE PATIENT WAS SEEN PRIOR TO SURGERY TODAY.  HISTORY, ALLERGIES, HOME MEDICATIONS AND OPERATIVE PROCEDURE WERE REVIEWED. RISKS AND BENEFITS OF SURGERY DISCUSSED WITH PATIENT AGAIN.  NO CHANGES FROM INITIAL HISTORY AND PHYSICAL NOTED.    

## 2016-07-24 NOTE — Transfer of Care (Signed)
Immediate Anesthesia Transfer of Care Note  Patient: Edwin Reed  Procedure(s) Performed: Procedure(s): TOTAL KNEE ARTHROPLASTY (Right)  Patient Location: PACU  Anesthesia Type:Spinal  Level of Consciousness: awake, alert , oriented and patient cooperative  Airway & Oxygen Therapy: Patient Spontanous Breathing and Patient connected to nasal cannula oxygen  Post-op Assessment: Report given to RN and Post -op Vital signs reviewed and stable  Post vital signs: Reviewed and stable  Last Vitals:  Vitals:   07/24/16 0622 07/24/16 1106  BP: (!) 152/78 113/77  Pulse:  73  Resp:  11  Temp:  36.2 C    Last Pain:  Vitals:   07/24/16 1106  TempSrc:   PainSc: Asleep         Complications: No apparent anesthesia complications

## 2016-07-24 NOTE — Anesthesia Procedure Notes (Deleted)
Spinal

## 2016-07-24 NOTE — NC FL2 (Signed)
[place] MEDICAID [id number] LEVEL OF CARE SCREENING TOOL     IDENTIFICATION  Patient Name: Edwin Reed Birthdate: Oct 05, 1955 Sex: male Admission Date (Current Location): 07/24/2016  [place] and [place] Number:  Chiropodist and Address:  [facility], [address] [place], [place] [address]      Provider Number: [id number]  Attending Physician Name and Address:  Deeann Saint, MD  Relative Name and Phone Number:       Current Level of Care: Hospital Recommended Level of Care: Skilled Nursing Facility Prior Approval Number:    Date Approved/Denied:   PASRR Number:  ([id number] )  Discharge Plan: SNF    Current Diagnoses: Patient Active Problem List   Diagnosis Date Noted  . 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 Sebaceous cyst 03/23/2015  . FOM (frequency of micturition) 07/14/2014  . History of colon polyps 11/04/2013  . 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 Type 2 diabetes mellitus (HCC) 06/28/2011  . Adiposity 06/28/2011  . Congested 06/28/2011  . Benign hypertension 06/28/2011  . 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    Orientation RESPIRATION BLADDER Height & Weight     Self, Time, Situation, Place  Normal Continent Weight: 270 lb (122.5 kg) Height:     BEHAVIORAL SYMPTOMS/MOOD NEUROLOGICAL BOWEL NUTRITION STATUS   (none)  (none) Continent Diet (Diet: Carb Modified )  AMBULATORY STATUS COMMUNICATION OF NEEDS Skin   Extensive Assist Verbally Surgical wounds (Incision: Right Knee )                       Personal Care Assistance Level of Assistance  Bathing, Feeding, Dressing Bathing Assistance: Limited assistance Feeding assistance: Independent Dressing Assistance: Limited assistance     Functional Limitations Info  Sight, Hearing, Speech Sight Info: Adequate Hearing Info: Adequate Speech Info: Adequate    SPECIAL CARE FACTORS FREQUENCY  PT (By licensed PT), OT (By licensed OT)      PT Frequency:  (5) OT Frequency:  (5)            Contractures      Additional Factors Info  Code Status, Allergies Code Status Info:  (Full Code. ) Allergies Info:  (No Known Allergies. )           Current Medications (07/24/2016):  This is the current hospital active medication list Current Facility-Administered Medications  Medication Dose Route Frequency Provider Last Rate Last Dose  . 0.45 % sodium chloride infusion   Intravenous Continuous Deeann Saint, MD 75 mL/hr at 07/24/16 1315    . acetaminophen (TYLENOL) tablet 650 mg  650 mg Oral Q6H PRN Deeann Saint, MD       Or  . acetaminophen (TYLENOL) suppository 650 mg  650 mg Rectal Q6H PRN Deeann Saint, MD      . acetaminophen (TYLENOL) tablet 1,000 mg  1,000 mg Oral Q6H Deeann Saint, MD      . alum & mag hydroxide-simeth (MAALOX/MYLANTA) 200-200-20 MG/5ML suspension 30 mL  30 mL Oral Q4H PRN Deeann Saint, MD      . Melene Muller ON 07/25/2016] amLODipine (NORVASC) tablet 10 mg  10 mg Oral Alfredia Client, MD      . aspirin EC tablet 81 mg  81 mg Oral Daily Deeann Saint, MD   81 mg at 07/24/16 1557  . atorvastatin (LIPITOR) tablet 80 mg  80 mg Oral QPM Deeann Saint, MD      . bisacodyl (DULCOLAX) suppository 10 mg  10 mg  Rectal Daily PRN Deeann Saint, MD      . celecoxib (CELEBREX) capsule 200 mg  200 mg Oral Q12H Deeann Saint, MD   200 mg at 07/24/16 1558  . diphenhydrAMINE (BENADRYL) 12.5 MG/5ML elixir 12.5-25 mg  12.5-25 mg Oral Q4H PRN Deeann Saint, MD      . Melene Muller ON 07/25/2016] enalapril (VASOTEC) tablet 20 mg  20 mg Oral Alfredia Client, MD      . Melene Muller ON 07/25/2016] enoxaparin (LOVENOX) injection 30 mg  30 mg Subcutaneous Q24H Deeann Saint, MD      . ferrous sulfate tablet 325 mg  325 mg Oral TID PC Deeann Saint, MD      . fluticasone [facility]) 50 MCG/ACT nasal spray 1 spray  1 spray Each Nare Daily PRN Deeann Saint, MD      . gabapentin (NEURONTIN) capsule 400 mg  400 mg Oral BID Deeann Saint, MD       . hydrochlorothiazide (HYDRODIURIL) tablet 25 mg  25 mg Oral Daily Deeann Saint, MD   25 mg at 07/24/16 1558  . insulin aspart protamine- aspart (NOVOLOG MIX 70/30) injection 10 Units  10 Units Subcutaneous Q supper Deeann Saint, MD      . Melene Muller ON 07/25/2016] insulin aspart protamine- aspart (NOVOLOG MIX 70/30) injection 30 Units  30 Units Subcutaneous Q breakfast Deeann Saint, MD      . Melene Muller ON 07/25/2016] liraglutide SOPN 1.8 mg  1.8 mg Subcutaneous BH-q7a Deeann Saint, MD      . magnesium hydroxide (MILK OF MAGNESIA) suspension 30 mL  30 mL Oral Daily PRN Deeann Saint, MD      . menthol-cetylpyridinium (CEPACOL) lozenge 3 mg  1 lozenge Oral PRN Deeann Saint, MD       Or  . phenol (CHLORASEPTIC) mouth spray 1 spray  1 spray Mouth/Throat PRN Deeann Saint, MD      . metFORMIN (GLUCOPHAGE-XR) 24 hr tablet 500 mg  500 mg Oral BID WC Deeann Saint, MD      . methocarbamol (ROBAXIN) tablet 500 mg  500 mg Oral Q6H PRN Deeann Saint, MD   500 mg at 07/24/16 1557   Or  . methocarbamol (ROBAXIN) 500 mg in dextrose 5 % 50 mL IVPB  500 mg Intravenous Q6H PRN Deeann Saint, MD      . metoCLOPramide (REGLAN) tablet 5-10 mg  5-10 mg Oral Q8H PRN Deeann Saint, MD       Or  . metoCLOPramide (REGLAN) injection 5-10 mg  5-10 mg Intravenous Q8H PRN Deeann Saint, MD      . metoprolol tartrate (LOPRESSOR) tablet 75 mg  75 mg Oral BID Deeann Saint, MD      . morphine 2 MG/ML injection 2 mg  2 mg Intravenous Q2H PRN Deeann Saint, MD      . multivitamin with minerals tablet 1 tablet  1 tablet Oral Daily Deeann Saint, MD      . ondansetron [facility]) tablet 4 mg  4 mg Oral Q6H PRN Deeann Saint, MD       Or  . ondansetron [facility]) injection 4 mg  4 mg Intravenous Q6H PRN Deeann Saint, MD      . oxyCODONE (Oxy IR/ROXICODONE) immediate release tablet 5-10 mg  5-10 mg Oral Q3H PRN Deeann Saint, MD   10 mg at 07/24/16 1557  . senna (SENOKOT) tablet 8.6 mg  1 tablet Oral BID Deeann Saint, MD   8.6 mg at  07/24/16 1558  . sodium phosphate (FLEET) 7-19 GM/118ML  enema 1 enema  1 enema Rectal Once PRN Deeann Saint, MD      . vancomycin (VANCOCIN) 1,500 mg in sodium chloride 0.9 % 500 mL IVPB  1,500 mg Intravenous Q12H Deeann Saint, MD         Discharge Medications: Please see discharge summary for a list of discharge medications.  Relevant Imaging Results:  Relevant Lab Results:   Additional Information  (SSN: [id number])  Gearline Spilman, Darleen Crocker, LCSW

## 2016-07-25 ENCOUNTER — Encounter: Payer: Self-pay | Admitting: Specialist

## 2016-07-25 LAB — CBC
HEMATOCRIT: 30 % — AB (ref 40.0–52.0)
Hemoglobin: 9.9 g/dL — ABNORMAL LOW (ref 13.0–18.0)
MCH: 29.9 pg (ref 26.0–34.0)
MCHC: 33.1 g/dL (ref 32.0–36.0)
MCV: 90.4 fL (ref 80.0–100.0)
Platelets: 180 10*3/uL (ref 150–440)
RBC: 3.32 MIL/uL — ABNORMAL LOW (ref 4.40–5.90)
RDW: 15.4 % — AB (ref 11.5–14.5)
WBC: 6 10*3/uL (ref 3.8–10.6)

## 2016-07-25 LAB — BASIC METABOLIC PANEL
Anion gap: 7 (ref 5–15)
BUN: 20 mg/dL (ref 6–20)
CALCIUM: 8.1 mg/dL — AB (ref 8.9–10.3)
CO2: 23 mmol/L (ref 22–32)
CREATININE: 1.23 mg/dL (ref 0.61–1.24)
Chloride: 104 mmol/L (ref 101–111)
GFR calc non Af Amer: >60 mL/min (ref >60)
Glucose, Bld: 244 mg/dL — ABNORMAL HIGH (ref 65–99)
Potassium: 4.1 mmol/L (ref 3.5–5.1)
Sodium: 134 mmol/L — ABNORMAL LOW (ref 135–145)

## 2016-07-25 LAB — GLUCOSE, CAPILLARY
GLUCOSE-CAPILLARY: 223 mg/dL — AB (ref 65–99)
Glucose-Capillary: 232 mg/dL — ABNORMAL HIGH (ref 65–99)
Glucose-Capillary: 223 mg/dL — ABNORMAL HIGH (ref 65–99)
Glucose-Capillary: 194 mg/dL — ABNORMAL HIGH (ref 65–99)

## 2016-07-25 MED ORDER — ENOXAPARIN SODIUM 40 MG/0.4ML ~~LOC~~ SOLN
40.0000 mg | SUBCUTANEOUS | Status: DC
  Administered 2016-07-26: 40 mg via SUBCUTANEOUS
  Filled 2016-07-25: qty 0.4

## 2016-07-25 MED ORDER — POLYETHYLENE GLYCOL 3350 17 G PO PACK
17.0000 g | PACK | Freq: Every day | ORAL | Status: DC
  Administered 2016-07-26: 17 g via ORAL
  Filled 2016-07-25: qty 1

## 2016-07-25 NOTE — Progress Notes (Signed)
Order for enoxaparin 30 mg subQ daily for DVT prophylaxis was changed to enoxaparin 40 mg subQ daily per anticoagulation protocol for CrCl > 30 mL/min.  Cindi Carbon, PharmD 07/25/16 12:02 PM

## 2016-07-25 NOTE — Progress Notes (Signed)
qPhysical Therapy Treatment Patient Details Name: Edwin Reed MRN: [id number] DOB: Mar 15, 1956 Today's Date: 07/25/2016    History of Present Illness Pt is a 61 y.o. male s/p elective R TKA 07/24/16.  Pt seen for initial OT eval on POD #1.  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    PT Comments    Pt agreeable to PT; reports 4-5/10 pain R knee. No other voiced complaints. Pt participates well with stretching/strengthening exercises with ability to demonstrate SLR on R with < 10 degree lag 10 times. Knee immobilizer not used for ambulation. No incidence of buckling during ambulation or stair climbing. Pt progressing range in right knee, ambulation distance and stairs. Continue PT to progress all area to improve functional mobility.    Follow Up Recommendations  Home health PT     Equipment Recommendations   (has equipment at home)    Recommendations for Other Services       Precautions / Restrictions Precautions Precautions: Knee;Fall Required Braces or Orthoses: Knee Immobilizer - Right Knee Immobilizer - Right: On at all times Restrictions Weight Bearing Restrictions: Yes RLE Weight Bearing: Partial weight bearing    Mobility  Bed Mobility Overal bed mobility: Modified Independent Bed Mobility: Supine to Sit     Supine to sit: Modified independent (Device/Increase time)     General bed mobility comments: did not assess, pt up in recliner at start of session  Transfers Overall transfer level: Needs assistance Equipment used: Rolling walker (2 wheeled) Transfers: Sit to/from Stand Sit to Stand: Min guard         General transfer comment: Cues to use RLE  Ambulation/Gait Ambulation/Gait assistance: Min guard Ambulation Distance (Feet): 95 Feet Assistive device: Rolling walker (2 wheeled) Gait Pattern/deviations: Step-to pattern;Decreased step length - left;Decreased stance time - right;Decreased weight shift to right (R PWB) Gait velocity:  decreased Gait velocity interpretation: <1.8 ft/sec, indicative of risk for recurrent falls General Gait Details: Cues for PWB compliance consistently.    Stairs Stairs: Yes   Stair Management: One rail Left;Step to pattern;Forwards (Assist on opposite side of rail for PWB) Number of Stairs: 6 General stair comments: Cues for compliance with PWB status on R  Wheelchair Mobility    Modified Rankin (Stroke Patients Only)       Balance Overall balance assessment: Needs assistance Sitting-balance support: No upper extremity supported;Feet supported Sitting balance-Leahy Scale: Good     Standing balance support: During functional activity;Bilateral upper extremity supported Standing balance-Leahy Scale: Good                              Cognition Arousal/Alertness: Awake/alert Behavior During Therapy: WFL for tasks assessed/performed Overall Cognitive Status: Within Functional Limits for tasks assessed                                        Exercises Total Joint Exercises Quad Sets: Strengthening;Both;20 reps;Supine;Other (comment) (10x in stand also) Straight Leg Raises: Strengthening;Right;10 reps;Supine Knee Flexion: AROM;Right;10 reps;Seated (3 positions each rep with 10 sec hold each) Goniometric ROM: 6-79 Other Exercises Other Exercises: Pt educated in home/routines modifications and bathroom set up/AE/DME to support functional independence and safety    General Comments General comments (skin integrity, edema, etc.): polar care, hemovac, TENS unit in place, KI doffed while in recliner at start of session  Pertinent Vitals/Pain Pain Assessment: 0-10 Pain Score: 10-Worst pain ever Pain Location: R knee after negotiating stairs with PT prior to OT evaluation Pain Descriptors / Indicators: Sore;Tender;Operative site guarding;Grimacing;Guarding Pain Intervention(s): Limited activity within patient's tolerance;RN gave pain meds during  session;Monitored during session;Ice applied    Home Living Family/patient expects to be discharged to:: Private residence Living Arrangements: Spouse/significant other Available Help at Discharge: Family;Available PRN/intermittently Type of Home: House Home Access: Stairs to enter Entrance Stairs-Rails: None Home Layout: One level Home Equipment: Cane - single point;Walker - 2 wheels;Grab bars - tub/shower      Prior Function Level of Independence: Independent      Comments: Independent with all ADL/IADL, including driving, med mgt, diabetes mgt.  Not using any AD prior to this surgery.  Denies any falls in past 6 months.   PT Goals (current goals can now be found in the care plan section) Acute Rehab PT Goals Patient Stated Goal: to go home Progress towards PT goals: Progressing toward goals    Frequency    BID      PT Plan Current plan remains appropriate    Co-evaluation             End of Session Equipment Utilized During Treatment: Gait belt Activity Tolerance: Patient tolerated treatment well Patient left: in chair;with call bell/phone within reach;with chair alarm set;with SCD's reapplied;Other (comment) (polar care in place)   PT Visit Diagnosis: Difficulty in walking, not elsewhere classified (R26.2);Pain Pain - Right/Left: Right Pain - part of body: Knee     Time: 1003-1047 PT Time Calculation (min) (ACUTE ONLY): 44 min  Charges:  $Gait Training: 23-37 mins $Therapeutic Exercise: 8-22 mins                    G Codes:        Scot Dock, PTA 07/25/2016, 1:04 PM

## 2016-07-25 NOTE — Progress Notes (Signed)
Inpatient Diabetes Program Recommendations  AACE/ADA: New Consensus Statement on Inpatient Glycemic Control (2015)  Target Ranges:  Prepandial:   less than 140 mg/dL      Peak postprandial:   less than 180 mg/dL (1-2 hours)      Critically ill patients:  140 - 180 mg/dL   Lab Results  Component Value Date   GLUCAP 232 (H) 07/25/2016    Review of Glycemic Control  Results for Edwin Reed, Edwin Reed (MRN [id number]) as of 07/25/2016 08:09  Ref. Range 07/24/2016 06:08 07/24/2016 11:13 07/24/2016 17:21 07/24/2016 21:20 07/25/2016 07:37  Glucose-Capillary Latest Ref Range: 65 - 99 mg/dL 956 (H) 213 (H) 086 (H) 221 (H) 232 (H)    Diabetes history: Type 2 Outpatient Diabetes medications: Novolin 70/30 30 units with breakfast, 10 units with supper, Victoza 1.8mg  qday, Metformin  XL bid Current orders for Inpatient glycemic control:Novolog 70/30 30 units with breakfast, Novolog  70/30  10 units with supper, Liraglutide 1.8mg  qday, Metformin  XL bid   Inpatient Diabetes Program Recommendations:  Spoke with patient at the bedside- he takes all his medications at home as documented- checks his blood sugars 2x/day.   Elevated CBG last night and this am. Consider adding Novolog moderate correction insulin 0-15 units tid , Novolog 0-5 units qhs  Susette Racer, RN, [place], [place], CDE Diabetes Coordinator Inpatient Diabetes Program  [phone] (Team Pager) [phone] [facility] Office) 07/25/2016 9:00 AM

## 2016-07-25 NOTE — Progress Notes (Signed)
qPhysical Therapy Treatment Patient Details Name: Edwin Reed MRN: [id number] DOB: 03-22-1956 Today's Date: 07/25/2016    History of Present Illness Pt is a 61 y.o. male s/p elective R TKA 07/24/16.  Pt seen for initial PT eval on POD #0.  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    PT Comments    Pt agreeable to PT; reports increased pain after morning session short lived. Pt currently 4/10 pain. Pt progressing ambulation to 240 ft today with supervision/Min guard. Pt performing stretching for extension and flexion throughout day and this session. Pt returned to bed on CPM machine. Continue PT to progress all areas to improve functional mobility and return home with optimal safety.    Follow Up Recommendations  Home health PT     Equipment Recommendations   (has equipment at home)    Recommendations for Other Services       Precautions / Restrictions Precautions Precautions: Knee;Fall Required Braces or Orthoses:  (Can SLR 10x with less than 10 degree lag) Knee Immobilizer - Right: On at all times Restrictions Weight Bearing Restrictions: Yes RLE Weight Bearing: Partial weight bearing    Mobility  Bed Mobility Overal bed mobility: Modified Independent Bed Mobility: Supine to Sit     Supine to sit: Modified independent (Device/Increase time)     General bed mobility comments: did not assess, pt up in recliner at start of session  Transfers Overall transfer level: Needs assistance Equipment used: Rolling walker (2 wheeled) Transfers: Sit to/from Stand Sit to Stand: Min guard         General transfer comment: Cues to use RLE  Ambulation/Gait Ambulation/Gait assistance: Min guard Ambulation Distance (Feet): 240 Feet Assistive device: Rolling walker (2 wheeled) Gait Pattern/deviations: Step-to pattern;Decreased step length - left;Decreased stance time - right;Decreased weight shift to right (R PWB) Gait velocity: decreased Gait velocity  interpretation: Below normal speed for age/gender General Gait Details: Cues for PWB compliance consistently.    Stairs            Wheelchair Mobility    Modified Rankin (Stroke Patients Only)       Balance Overall balance assessment: Needs assistance Sitting-balance support: No upper extremity supported;Feet supported Sitting balance-Leahy Scale: Good     Standing balance support: Bilateral upper extremity supported;During functional activity Standing balance-Leahy Scale: Good                              Cognition Arousal/Alertness: Awake/alert Behavior During Therapy: WFL for tasks assessed/performed Overall Cognitive Status: Within Functional Limits for tasks assessed                                        Exercises Total Joint Exercises Quad Sets: Strengthening;Both;20 reps;Supine;Other (comment) (10x in stand also) Knee Flexion: AROM;Right;10 reps;Seated (3 positions each rep with 10 sec hold each) Other Exercises Other Exercises: Pt educated in home/routines modifications and bathroom set up/AE/DME to support functional independence and safety    General Comments General comments (skin integrity, edema, etc.): polar care, hemovac, TENS unit in place, KI doffed while in recliner at start of session      Pertinent Vitals/Pain Pain Assessment: 0-10 Pain Score: 4  Pain Location: R knee Pain Descriptors / Indicators: Constant;Operative site guarding Pain Intervention(s): Monitored during session;Premedicated before session;Repositioned;Ice applied    Home Living Family/patient  expects to be discharged to:: Private residence Living Arrangements: Spouse/significant other Available Help at Discharge: Family;Available PRN/intermittently Type of Home: House Home Access: Stairs to enter Entrance Stairs-Rails: None Home Layout: One level Home Equipment: Cane - single point;Walker - 2 wheels;Grab bars - tub/shower      Prior  Function Level of Independence: Independent      Comments: Independent with all ADL/IADL, including driving, med mgt, diabetes mgt.  Not using any AD prior to this surgery.  Denies any falls in past 6 months.   PT Goals (current goals can now be found in the care plan section) Acute Rehab PT Goals Patient Stated Goal: to go home Progress towards PT goals: Progressing toward goals    Frequency    BID      PT Plan Current plan remains appropriate    Co-evaluation             End of Session Equipment Utilized During Treatment: Gait belt Activity Tolerance: Patient tolerated treatment well Patient left: in chair;with call bell/phone within reach;with chair alarm set;with SCD's reapplied;Other (comment) (polar care in place)   PT Visit Diagnosis: Difficulty in walking, not elsewhere classified (R26.2);Pain Pain - Right/Left: Right Pain - part of body: Knee     Time: 1610-9604 PT Time Calculation (min) (ACUTE ONLY): 25 min  Charges:  $Gait Training: 8-22 mins $Therapeutic Exercise: 8-22 mins                    G Codes:        Scot Dock, PTA 07/25/2016, 3:02 PM

## 2016-07-25 NOTE — Anesthesia Postprocedure Evaluation (Signed)
Anesthesia Post Note  Patient: Edwin Reed  Procedure(s) Performed: Procedure(s) (LRB): TOTAL KNEE ARTHROPLASTY (Right)  Patient location during evaluation: Nursing Unit Anesthesia Type: Spinal Level of consciousness: awake and alert and oriented Pain management: pain level controlled Vital Signs Assessment: post-procedure vital signs reviewed and stable Respiratory status: spontaneous breathing Cardiovascular status: stable Postop Assessment: no signs of nausea or vomiting and adequate PO intake Anesthetic complications: no     Last Vitals:  Vitals:   07/25/16 0039 07/25/16 0626  BP: 123/66 (!) 119/54  Pulse: 71 65  Resp: 18   Temp: 36.9 C     Last Pain:  Vitals:   07/25/16 0039  TempSrc: Oral  PainSc:                  [name]

## 2016-07-25 NOTE — Care Management Note (Signed)
Case Management Note  Patient Details  Name: Edwin Reed MRN: [id number] Date of Birth: 1955/05/29  Subjective/Objective: Spoke with patient. He has OP PT set up at Dr. Garen Lah office beginning next Tuesday, April 10. He has a walker. No other DME needs. Lives with spouse. Has good support system.  RNCM  Identified no other needs. Case Closed.                      Action/Plan:   Expected Discharge Date:                  Expected Discharge Plan:  Home/Self Care  In-House Referral:     Discharge planning Services  CM Consult  Post Acute Care Choice:    Choice offered to:     DME Arranged:    DME Agency:     HH Arranged:    HH Agency:     Status of Service:  Completed, signed off  If discussed at [employer] of Stay Meetings, dates discussed:    Additional Comments:  Marily Memos, RN 07/25/2016, 2:41 PM

## 2016-07-25 NOTE — Progress Notes (Signed)
Clinical Social Worker (CSW) received SNF consult. PT is recommending home health. RN case manager aware of above. Please reconsult if future social work needs arise. CSW signing off.   Shastina Rua, LCSW [phone] 

## 2016-07-25 NOTE — Evaluation (Signed)
Occupational Therapy Evaluation Patient Details Name: Edwin Reed MRN: [id number] DOB: January 31, 1956 Today's Date: 07/25/2016    History of Present Illness Pt is a 61 y.o. male s/p elective R TKA 07/24/16.  Pt seen for initial OT eval on POD #1.  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   Clinical Impression   Pt seen for OT evaluation this date. Pt indep in ADL, IADL including chronic disease mgt and driving with L TKA in January with all DME/AE at home and eager to return to PLOF. Pt presents with 10/10 pain in R knee after working with PT prior to session, decreased strength/ROM in RLE, and activity tolerance. Pt reports plans with spouse to renovate bathroom in future to support aging in place. Pt educated in home/routines modifications, AE/DME, and recommendations for bathroom set up/equipment to support aging in place. Pt would benefit from skilled OT services while in the hospital to address noted impairments and functional deficits to functional transfers, toileting, and LB dressing to maximize functional return to PLOF.     Follow Up Recommendations  No OT follow up    Equipment Recommendations  None recommended by OT    Recommendations for Other Services       Precautions / Restrictions Precautions Precautions: Knee;Fall Required Braces or Orthoses: Knee Immobilizer - Right Knee Immobilizer - Right: On at all times Restrictions Weight Bearing Restrictions: Yes RLE Weight Bearing: Partial weight bearing      Mobility Bed Mobility Overal bed mobility: Modified Independent Bed Mobility: Supine to Sit     Supine to sit: Modified independent (Device/Increase time)     General bed mobility comments: did not assess, pt up in recliner at start of session  Transfers Overall transfer level: Needs assistance Equipment used: Rolling walker (2 wheeled) Transfers: Sit to/from Stand Sit to Stand: Min guard              Balance Overall balance  assessment: Needs assistance Sitting-balance support: No upper extremity supported;Feet supported Sitting balance-Leahy Scale: Good     Standing balance support: During functional activity;Bilateral upper extremity supported Standing balance-Leahy Scale: Good                             ADL either performed or assessed with clinical judgement   ADL Overall ADL's : Needs assistance/impaired Eating/Feeding: Sitting;Set up   Grooming: Min guard;Standing;Set up   Upper Body Bathing: Sitting;Set up;Supervision/ safety   Lower Body Bathing: Minimal assistance;Sit to/from stand   Upper Body Dressing : Set up;Sitting   Lower Body Dressing: Minimal assistance;Sit to/from Market researcher Details (indicate cue type and reason): deferred due to pain/fatigue           General ADL Comments: pt generally min assist for LB ADL, L TKR in January, wife able to provide sufficient assist for ADL as needed     Vision Baseline Vision/History: Wears glasses Wears Glasses: At all times Patient Visual Report: No change from baseline Vision Assessment?: No apparent visual deficits     Perception     Praxis Praxis Praxis tested?: Within functional limits    Pertinent Vitals/Pain Pain Assessment: 0-10 Pain Score: 10-Worst pain ever Pain Location: R knee after negotiating stairs with PT prior to OT evaluation Pain Descriptors / Indicators: Sore;Tender;Operative site guarding;Grimacing;Guarding Pain Intervention(s): Limited activity within patient's tolerance;RN gave pain meds during session;Monitored during session;Ice applied     Hand Dominance  Right   Extremity/Trunk Assessment Upper Extremity Assessment Upper Extremity Assessment: Overall WFL for tasks assessed   Lower Extremity Assessment Lower Extremity Assessment: Defer to PT evaluation;RLE deficits/detail   Cervical / Trunk Assessment Cervical / Trunk Assessment: Normal   Communication  Communication Communication: No difficulties   Cognition Arousal/Alertness: Awake/alert Behavior During Therapy: WFL for tasks assessed/performed Overall Cognitive Status: Within Functional Limits for tasks assessed                                     General Comments  polar care, hemovac, TENS unit in place, KI doffed while in recliner at start of session    Exercises  Other Exercises Other Exercises: Pt educated in home/routines modifications and bathroom set up/AE/DME to support functional independence and safety   Shoulder Instructions      Home Living Family/patient expects to be discharged to:: Private residence Living Arrangements: Spouse/significant other Available Help at Discharge: Family;Available PRN/intermittently Type of Home: House Home Access: Stairs to enter [employer] of Steps: 2 Entrance Stairs-Rails: None Home Layout: One level     Bathroom Shower/Tub: Chief Strategy Officer: Handicapped height Bathroom Accessibility: Yes How Accessible: Accessible via walker;Accessible via wheelchair Home Equipment: Gilmer Mor - single point;Walker - 2 wheels;Grab bars - tub/shower          Prior Functioning/Environment Level of Independence: Independent        Comments: Independent with all ADL/IADL, including driving, med mgt, diabetes mgt.  Not using any AD prior to this surgery.  Denies any falls in past 6 months.        OT Problem List: Pain;Decreased range of motion;Decreased strength;Decreased activity tolerance      OT Treatment/Interventions: Self-care/ADL training;Energy conservation;DME and/or AE instruction;Patient/family education    OT Goals(Current goals can be found in the care plan section) Acute Rehab OT Goals Patient Stated Goal: to go home OT Goal Formulation: With patient Time For Goal Achievement: 08/01/16 Potential to Achieve Goals: Good ADL Goals Pt Will Perform Lower Body Dressing: sit to/from  stand;with min guard assist (using AE as needed) Pt Will Transfer to Toilet: with min guard assist;ambulating;grab bars (comfort height toilet, RW for ambulation)  OT Frequency: Min 1X/week   Barriers to D/C:            Co-evaluation              End of Session    Activity Tolerance: Patient limited by pain Patient left: in chair;with call bell/phone within reach;with chair alarm set;with nursing/sitter in room;Other (comment) (polar care in place)  OT Visit Diagnosis: Other abnormalities of gait and mobility (R26.89);Pain;Muscle weakness (generalized) (M62.81) Pain - Right/Left: Right Pain - part of body: Knee                Time: 1610-9604 OT Time Calculation (min): 18 min Charges:  OT General Charges $OT Visit: 1 Procedure OT Evaluation $OT Eval Low Complexity: 1 Procedure G-Codes:     Richrd Prime, MPH, MS, OTR/L ascom [phone] 07/25/16, 12:00 PM

## 2016-07-25 NOTE — Progress Notes (Signed)
Subjective: 1 Day Post-Op Procedure(s) (LRB): TOTAL KNEE ARTHROPLASTY (Right)    Patient reports pain as mild. OOB in chair.  Slept well.  Minimal pain.  CSM good. Objective:   VITALS:   Vitals:   07/25/16 0807 07/25/16 1003  BP: 137/69   Pulse: 73 74  Resp: 18   Temp: 98.9 F (37.2 C)     Neurologically intact ABD soft Neurovascular intact Sensation intact distally Intact pulses distally Dorsiflexion/Plantar flexion intact Incision: no drainage  LABS  Recent Labs  07/24/16 1334 07/25/16 0440  HGB 11.7* 9.9*  HCT 35.5* 30.0*  WBC 4.9 6.0  PLT 203 180     Recent Labs  07/24/16 1334 07/25/16 0440  NA  --  134*  K  --  4.1  BUN  --  20  CREATININE 1.14 1.23  GLUCOSE  --  244*    No results for input(s): LABPT, INR in the last 72 hours.   Assessment/Plan: 1 Day Post-Op Procedure(s) (LRB): TOTAL KNEE ARTHROPLASTY (Right)   Advance diet Up with therapy D/C IV fluids Discharge home with home health

## 2016-07-26 LAB — CBC
HEMATOCRIT: 26.9 % — AB (ref 40.0–52.0)
HEMOGLOBIN: 9.1 g/dL — AB (ref 13.0–18.0)
MCH: 30.1 pg (ref 26.0–34.0)
MCHC: 33.9 g/dL (ref 32.0–36.0)
MCV: 88.9 fL (ref 80.0–100.0)
Platelets: 176 10*3/uL (ref 150–440)
RBC: 3.02 MIL/uL — AB (ref 4.40–5.90)
RDW: 15.8 % — ABNORMAL HIGH (ref 11.5–14.5)
WBC: 5.4 10*3/uL (ref 3.8–10.6)

## 2016-07-26 LAB — GLUCOSE, CAPILLARY
GLUCOSE-CAPILLARY: 269 mg/dL — AB (ref 65–99)
Glucose-Capillary: 256 mg/dL — ABNORMAL HIGH (ref 65–99)

## 2016-07-26 MED ORDER — ASPIRIN EC 325 MG PO TBEC: 325.0000 mg | DELAYED_RELEASE_TABLET | Freq: Every day | ORAL | 0 refills | Status: AC

## 2016-07-26 MED ORDER — LIDOCAINE-PRILOCAINE 2.5-2.5 % EX CREA
TOPICAL_CREAM | Freq: Once | CUTANEOUS | Status: DC
  Filled 2016-07-26: qty 5

## 2016-07-26 MED ORDER — GABAPENTIN 400 MG PO CAPS: 400.0000 mg | ORAL_CAPSULE | Freq: Two times a day (BID) | ORAL | 3 refills | Status: DC

## 2016-07-26 MED ORDER — HYDROCODONE-ACETAMINOPHEN 7.5-325 MG PO TABS: 1.0000 | ORAL_TABLET | Freq: Four times a day (QID) | ORAL | 0 refills | Status: DC | PRN

## 2016-07-26 NOTE — Progress Notes (Signed)
Pt ready for discharge home with wife. Wife at bedside. Dressing changed, instructions given regarding showering with dressing. Discussed instructions for discharge and reviewed prescriptions.

## 2016-07-26 NOTE — Progress Notes (Signed)
Inpatient Diabetes Program Recommendations  AACE/ADA: New Consensus Statement on Inpatient Glycemic Control (2015)  Target Ranges:  Prepandial:   less than 140 mg/dL      Peak postprandial:   less than 180 mg/dL (1-2 hours)      Critically ill patients:  140 - 180 mg/dL   Lab Results  Component Value Date   GLUCAP 269 (H) 07/26/2016    A1C 7.5% on 07/03/16   Review of Glycemic Control  Results for KHALEE, MAZO (MRN [id number]) as of 07/26/2016 10:53  Ref. Range 07/25/2016 11:33 07/25/2016 16:18 07/25/2016 21:11 07/26/2016 08:15  Glucose-Capillary Latest Ref Range: 65 - 99 mg/dL 409 (H) 811 (H) 914 (H) 269 (H)    Diabetes history: Type 2 Outpatient Diabetes medications: Novolin 70/30 30 units with breakfast, 10 units with supper, Victoza 1.8mg  qday, Metformin  XL bid  Current orders for Inpatient glycemic control:Novolog 70/30 30 units with breakfast, Novolog  70/30  10 units with supper, Liraglutide 1.8mg  qday, Metformin  XL bid   Inpatient Diabetes Program Recommendations:  Spoke with patient at the bedside- he takes all his medications at home as documented- checks his blood sugars 2x/day.   Elevated CBG last night and this am. Consider adding Novolog moderate correction insulin 0-15 units tid , Novolog 0-5 units qhs  Susette Racer, RN, [place], [place], CDE Diabetes Coordinator Inpatient Diabetes Program  [phone] (Team Pager) [phone] [facility] Office) 07/26/2016 10:57 AM

## 2016-07-26 NOTE — Progress Notes (Signed)
Physical Therapy Treatment Patient Details Name: Edwin Reed MRN: [id number] DOB: Feb 26, 1956 Today's Date: 07/26/2016    History of Present Illness Pt is a 61 y.o. male s/p elective R TKA 07/24/16.  Pt seen for initial PT eval on POD #0.  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    PT Comments    Pt very pleasant and agreeable to PT. Pt in recliner upon entry with RN administering medications. Pt was modified independent with transfers, supervision for ambulation for safety, and CGA for stairs with min assist for blocking and lifting RW. Pt practiced stairs using the same method pt used with his wife after d/c home s/p L TKR in January of this year. Pt also ambulated the loop around the nursing station and R knee flexion ROM measured at 6-90 degrees. Pt tolerated treatment session well with minimal increase in pain at end of session. Pt reported that he expects to d/c home this afternoon. PT will continue working on increasing strength, increasing ambulation distance, and increasing ROM during next treatment session. Current recommendations remain appropriate at this time.   Follow Up Recommendations  Home health PT     Equipment Recommendations  Rolling walker with 5" wheels (pt reports that he has a RW at home from last TKR.)    Recommendations for Other Services       Precautions / Restrictions Precautions Precautions: Knee;Fall Precaution Booklet Issued: Yes (comment) Required Braces or Orthoses: Knee Immobilizer - Right Knee Immobilizer - Right: On at all times Restrictions Weight Bearing Restrictions: Yes RLE Weight Bearing: Partial weight bearing    Mobility  Bed Mobility               General bed mobility comments: did not assess, pt up in recliner at start and end of session  Transfers Overall transfer level: Modified independent Equipment used: Rolling walker (2 wheeled) Transfers: Sit to/from Stand Sit to Stand: Modified independent  (Device/Increase time)         General transfer comment: supervision with mobility to the bathroom and back  Ambulation/Gait Ambulation/Gait assistance: Supervision Ambulation Distance (Feet): 200 Feet Assistive device: Rolling walker (2 wheeled) Gait Pattern/deviations: Step-to pattern;Decreased step length - left;Decreased stance time - right Gait velocity: decreased Gait velocity interpretation: Below normal speed for age/gender General Gait Details: V/c's provided before ambulation about PWB and using a step to pattern, stepping with the R leg first and utilizing his arms to unweight the R leg when stepping with the left. Pt followed instructions w/o v/c's during ambulation, only needing two reminders to slow down.   Stairs     Stair Management: No rails;Backwards;Forwards;With walker;Step to pattern Number of Stairs: 4 General stair comments: Demonstrated stairs with RW going up backwards and down forwards. Pt required minimal v/c's after demonstration; CGA for pt and min assist to block RW with hip and to help pt bring RW up/down to next step.  Wheelchair Mobility    Modified Rankin (Stroke Patients Only)       Balance Overall balance assessment: Modified Independent Sitting-balance support: No upper extremity supported;Feet supported Sitting balance-Leahy Scale: Good     Standing balance support: Bilateral upper extremity supported;During functional activity Standing balance-Leahy Scale: Good Standing balance comment: during transfer with walker                            Cognition Arousal/Alertness: Awake/alert Behavior During Therapy: WFL for tasks assessed/performed Overall Cognitive  Status: Within Functional Limits for tasks assessed                                        Exercises Total Joint Exercises Quad Sets: AROM;5 reps;Right;Seated (done in preparation to measure knee extension ROM ) Knee Flexion: AROM;Right;5  reps;Seated (done in preparation for measurement of knee flexion ROM) Goniometric ROM: 6-90 degrees R knee flexion ROM    General Comments        Pertinent Vitals/Pain Pain Assessment: 0-10 Pain Score: 3  Pain Location: R knee Pain Descriptors / Indicators: Constant;Tightness;Sore Pain Intervention(s): Limited activity within patient's tolerance;Repositioned;Monitored during session    Home Living Family/patient expects to be discharged to:: Private residence Living Arrangements: Spouse/significant other Available Help at Discharge: Family;Available PRN/intermittently Type of Home: House Home Access: Stairs to enter            Prior Function            PT Goals (current goals can now be found in the care plan section) Acute Rehab PT Goals Patient Stated Goal: to go home PT Goal Formulation: With patient Time For Goal Achievement: 08/07/16 Potential to Achieve Goals: Good Additional Goals Additional Goal #1: R knee ROM 0-90 degrees.    Frequency    BID      PT Plan Current plan remains appropriate    Co-evaluation             End of Session Equipment Utilized During Treatment: Gait belt Activity Tolerance: Patient tolerated treatment well Patient left: in chair;with call bell/phone within reach;with chair alarm set;with SCD's reapplied (with B heels elevated; with polar care reapplied; with knee immobilizer and tens unit applied) Nurse Communication: Mobility status;Patient requests pain meds PT Visit Diagnosis: Difficulty in walking, not elsewhere classified (R26.2);Pain Pain - Right/Left: Right Pain - part of body: Knee     Time: 0911-1006 PT Time Calculation (min) (ACUTE ONLY): 55 min  Charges:                       G Codes:         Oluwadarasimi Redmon, SPT 07/26/2016, 10:47 AM

## 2016-07-26 NOTE — Progress Notes (Signed)
Subjective: 2 Days Post-Op Procedure(s) (LRB): TOTAL KNEE ARTHROPLASTY (Right)    Patient reports pain as mild.  Objective:   VITALS:   Vitals:   07/25/16 2146 07/25/16 2352  BP: (!) 141/63 (!) 110/53  Pulse: 77 70  Resp:  18  Temp:  98.3 F (36.8 C)    Neurologically intact ABD soft Neurovascular intact Sensation intact distally Intact pulses distally Dorsiflexion/Plantar flexion intact Incision: scant drainage and dressings changed. Wound benign No cellulitis present  LABS  Recent Labs  07/24/16 1334 07/25/16 0440 07/26/16 0429  HGB 11.7* 9.9* 9.1*  HCT 35.5* 30.0* 26.9*  WBC 4.9 6.0 5.4  PLT 203 180 176     Recent Labs  07/24/16 1334 07/25/16 0440  NA  --  134*  K  --  4.1  BUN  --  20  CREATININE 1.14 1.23  GLUCOSE  --  244*    No results for input(s): LABPT, INR in the last 72 hours.   Assessment/Plan: 2 Days Post-Op Procedure(s) (LRB): TOTAL KNEE ARTHROPLASTY (Right)   D/C today RTc next week Start OPPT next week

## 2016-07-26 NOTE — Discharge Summary (Signed)
Physician Discharge Summary  Patient ID: Edwin Reed MRN: [id number] DOB/AGE: September 29, 1955 61 y.o.  Admit date: 07/24/2016 Discharge date: 07/26/2016  Admission Diagnoses: Arthritis right knee  Discharge Diagnoses:  Active Problems:   Total knee replacement status   Discharged Condition: good  Hospital Course: Right total knee replacement 07/24/16 without complication.  Post op x-rays satisfactory.  Benign post op course with minimal pain and good progress with PT.  Ready for D/C today  Consults: None  Significant Diagnostic Studies: radiology: X-Ray: Post op x-rays satisfactory  Treatments: antibiotics: vancomycin  Discharge Exam: Blood pressure (!) 110/53, pulse 70, temperature 98.3 F (36.8 C), temperature source Oral, resp. rate 18, height  (1.854 m), weight 122.5 kg (270 lb), SpO2 97 %.   Incision/Wound: Dressing chnaged. Wound benign.  CSM good  Disposition: 06-Home-Health Care Svc  Discharge Instructions    Call MD for:  persistant nausea and vomiting    Complete by:  As directed    Call MD for:  redness, tenderness, or signs of infection (pain, swelling, redness, odor or green/yellow discharge around incision site)    Complete by:  As directed    Call MD for:  severe uncontrolled pain    Complete by:  As directed    Call MD for:  temperature >100.4    Complete by:  As directed    Diet - low sodium heart healthy    Complete by:  As directed    Discharge instructions    Complete by:  As directed    Partial weight on right leg Buffered ASA 2x/day for 6 weeks RTC  next week   Increase activity slowly    Complete by:  As directed      Allergies as of 07/26/2016   No Known Allergies     Medication List    STOP taking these medications   ibuprofen 200 MG tablet Commonly known as:  ADVIL,MOTRIN     TAKE these medications   amLODipine 10 MG tablet Commonly known as:  NORVASC Take 10 mg by mouth every morning.   aspirin EC 325 MG tablet Take 1 tablet  (325 mg total) by mouth daily. What changed:  medication strength  how much to take   celecoxib 200 MG capsule Commonly known as:  CELEBREX Take 200 mg by mouth daily.   enalapril 20 MG tablet Commonly known as:  VASOTEC Take 20 mg by mouth every morning.   fluticasone 50 MCG/ACT nasal spray Commonly known as:  FLONASE 2 sprays by Each Nare route daily as needed for allergies   gabapentin 400 MG capsule Commonly known as:  NEURONTIN Take 1 capsule (400 mg total) by mouth 2 (two) times daily. What changed:  Another medication with the same name was added. Make sure you understand how and when to take each.   gabapentin 400 MG capsule Commonly known as:  NEURONTIN Take 1 capsule (400 mg total) by mouth 2 (two) times daily. What changed:  You were already taking a medication with the same name, and this prescription was added. Make sure you understand how and when to take each.   glucose blood test strip   ACCU-CHEK AVIVA PLUS test strip Generic drug:  glucose blood TEST BLOOD SUGAR QD UTD   hydrochlorothiazide 25 MG tablet Commonly known as:  HYDRODIURIL Take 25 mg by mouth daily.   HYDROcodone-acetaminophen 7.5-325 MG tablet Commonly known as:  NORCO Take 1 tablet by mouth every 6 (six) hours as needed for moderate pain.  insulin NPH-regular Human (70-30) 100 UNIT/ML injection Commonly known as:  NOVOLIN 70/30 Inject 10-30 Units into the skin 2 (two) times daily. Inject 30 units in the morning and 10 units at night   LIPITOR 80 MG tablet Generic drug:  atorvastatin Take 80 mg by mouth every evening.   metFORMIN 500 MG 24 hr tablet Commonly known as:  GLUCOPHAGE-XR Take 500 mg by mouth 2 (two) times daily.   metoprolol 50 MG tablet Commonly known as:  LOPRESSOR Take 75 mg by mouth 2 (two) times daily.   multivitamin with minerals Tabs tablet Take 1 tablet by mouth daily.   VIAGRA 100 MG tablet Generic drug:  sildenafil Take 100 mg by mouth as needed for  erectile dysfunction.   VICTOZA 18 MG/3ML Sopn Generic drug:  liraglutide Inject 1.8 mg into the skin every morning.        Signed: Valinda Hoar 07/26/2016, 1:32 PM

## 2016-07-26 NOTE — Progress Notes (Signed)
Occupational Therapy Treatment Patient Details Name: Edwin Reed MRN: [id number] DOB: 02-17-1956 Today's Date: 07/26/2016    History of present illness Pt is a 61 y.o. male s/p elective R TKA 07/24/16.  Pt seen for initial PT eval on POD #0.  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   OT comments  Patient was seen this date with focus on self care tasks with OT.  Patient ambulating to and from the bathroom with his walker and supervision for safety.  Toilet transfers with use of grab bars on regular toilet with supervision, grooming at the sink with supervision.  Patient familiar with adaptive equipment for lower body dressing and has a reacher at home.  He is able to demo doffing socks and requires min assist to don.  He is progressing well and do not anticipate further OT needs at discharge.   Follow Up Recommendations  No OT follow up    Equipment Recommendations  None recommended by OT    Recommendations for Other Services      Precautions / Restrictions Precautions Precautions: Knee;Fall Precaution Booklet Issued: Yes (comment) Required Braces or Orthoses: Knee Immobilizer - Right Knee Immobilizer - Right: On at all times Restrictions Weight Bearing Restrictions: Yes RLE Weight Bearing: Partial weight bearing       Mobility Bed Mobility               General bed mobility comments: did not assess, pt up in recliner at start and end of session  Transfers Overall transfer level: Modified independent Equipment used: Rolling walker (2 wheeled) Transfers: Sit to/from Stand Sit to Stand: Modified independent (Device/Increase time)         General transfer comment: supervision with mobility to the bathroom and back    Balance Overall balance assessment: Modified Independent Sitting-balance support: No upper extremity supported;Feet supported Sitting balance-Leahy Scale: Good     Standing balance support: Bilateral upper extremity  supported;During functional activity Standing balance-Leahy Scale: Good Standing balance comment: during transfer with walker                           ADL either performed or assessed with clinical judgement   ADL Overall ADL's : Needs assistance/impaired     Grooming: Standing;Set up;Supervision/safety               Lower Body Dressing: Minimal assistance;Sit to/from stand   Toilet Transfer: Supervision/safety                   Vision Baseline Vision/History: Wears glasses Wears Glasses: At all times Patient Visual Report: No change from baseline     Perception     Praxis      Cognition Arousal/Alertness: Awake/alert Behavior During Therapy: WFL for tasks assessed/performed Overall Cognitive Status: Within Functional Limits for tasks assessed                                          Exercises   Shoulder Instructions       General Comments      Pertinent Vitals/ Pain       Pain Assessment: 0-10 Pain Score: 3  Pain Location: R knee Pain Descriptors / Indicators: Constant;Tightness;Sore Pain Intervention(s): Limited activity within patient's tolerance;Repositioned;Monitored during session  Home Living Family/patient expects to be discharged to:: Private residence Living Arrangements: Spouse/significant  other Available Help at Discharge: Family;Available PRN/intermittently Type of Home: House Home Access: Stairs to enter                                Prior Functioning/Environment              Frequency  Min 1X/week        Progress Toward Goals  OT Goals(current goals can now be found in the care plan section)  Progress towards OT goals: Progressing toward goals  Acute Rehab OT Goals Patient Stated Goal: to go home OT Goal Formulation: With patient Time For Goal Achievement: 08/01/16 Potential to Achieve Goals: Good  Plan Discharge plan remains appropriate    Co-evaluation                  End of Session Equipment Utilized During Treatment: Gait belt;Right knee immobilizer  OT Visit Diagnosis: Other abnormalities of gait and mobility (R26.89);Pain;Muscle weakness (generalized) (M62.81) Pain - Right/Left: Right Pain - part of body: Knee   Activity Tolerance Patient tolerated treatment well   Patient Left in chair;with call bell/phone within reach;with chair alarm set   Nurse Communication          Time: 1610-9604 OT Time Calculation (min): 23 min  Charges: OT General Charges $OT Visit: 1 Procedure OT Treatments $Self Care/Home Management : 23-37 mins  Tremaine Fuhriman T Altagracia Rone, OTR/L, CLT    Emersynn Deatley 07/26/2016, 10:44 AM

## 2018-02-09 IMAGING — DX DG KNEE 1-2V PORT*L*
2 series · 2 of 2 positions shown · non-contrast
Comparison: No recent prior .

CLINICAL DATA: Surgery.

EXAM:
PORTABLE LEFT KNEE - 1-2 VIEW

[knee ap]
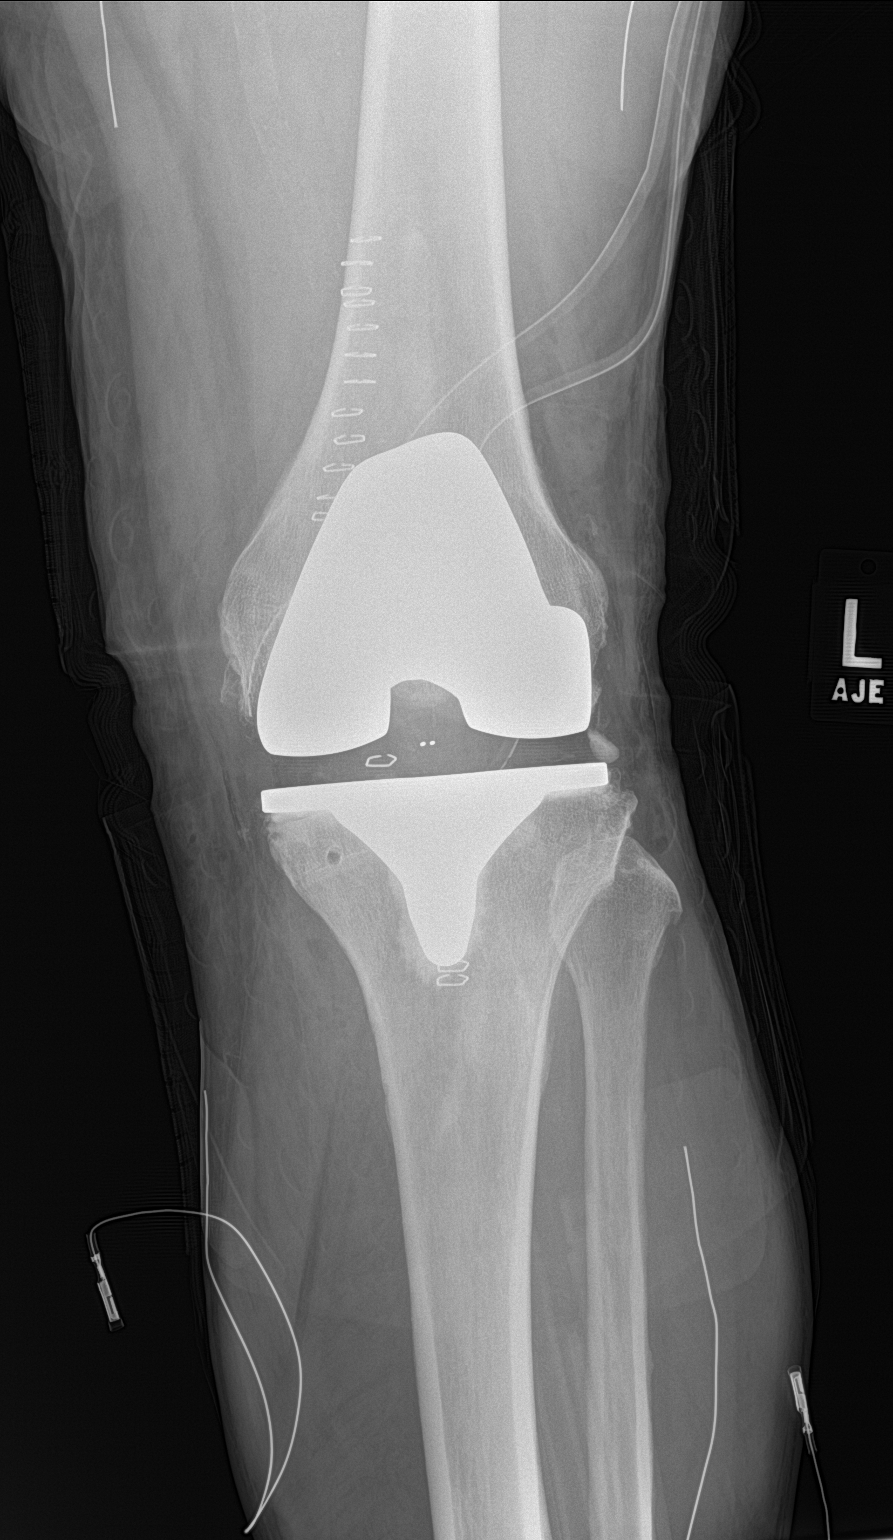

[knee lat]
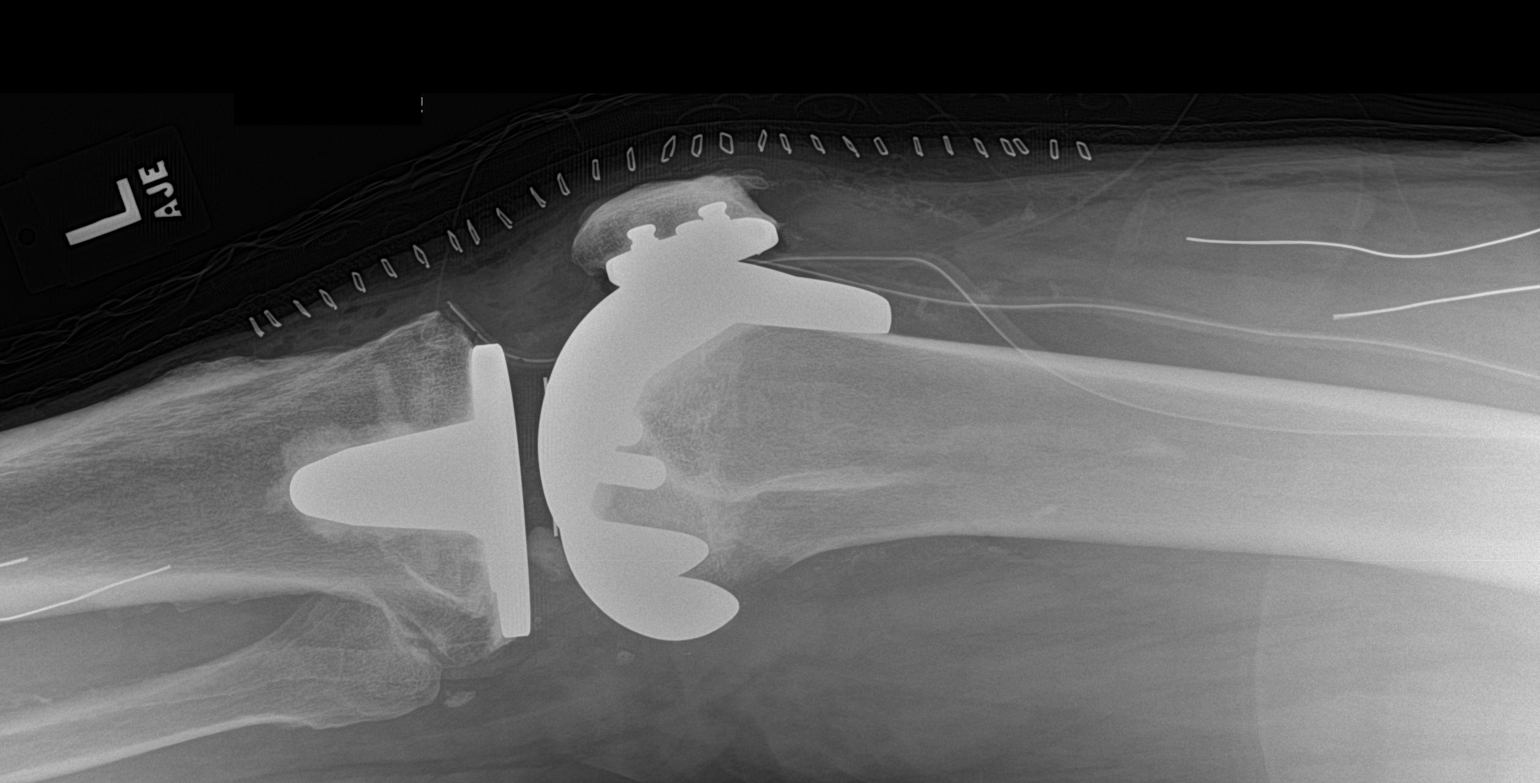

[2 of 2 positions shown; findings below may reference images not displayed]

FINDINGS: Total left knee replacement. Hardware intact. Anatomic alignment.
Loose bodies noted.
IMPRESSION: Total left knee replacement with anatomic alignment.

## 2018-05-04 IMAGING — DX DG KNEE 1-2V*R*
1 series · 1 of 1 positions shown · non-contrast
Comparison: None.

CLINICAL DATA: Intraoperative imaging for patient undergoing right
knee replacement. Possible unexpected foreign body.

EXAM:
RIGHT KNEE - 1-2 VIEW

[knee ap]
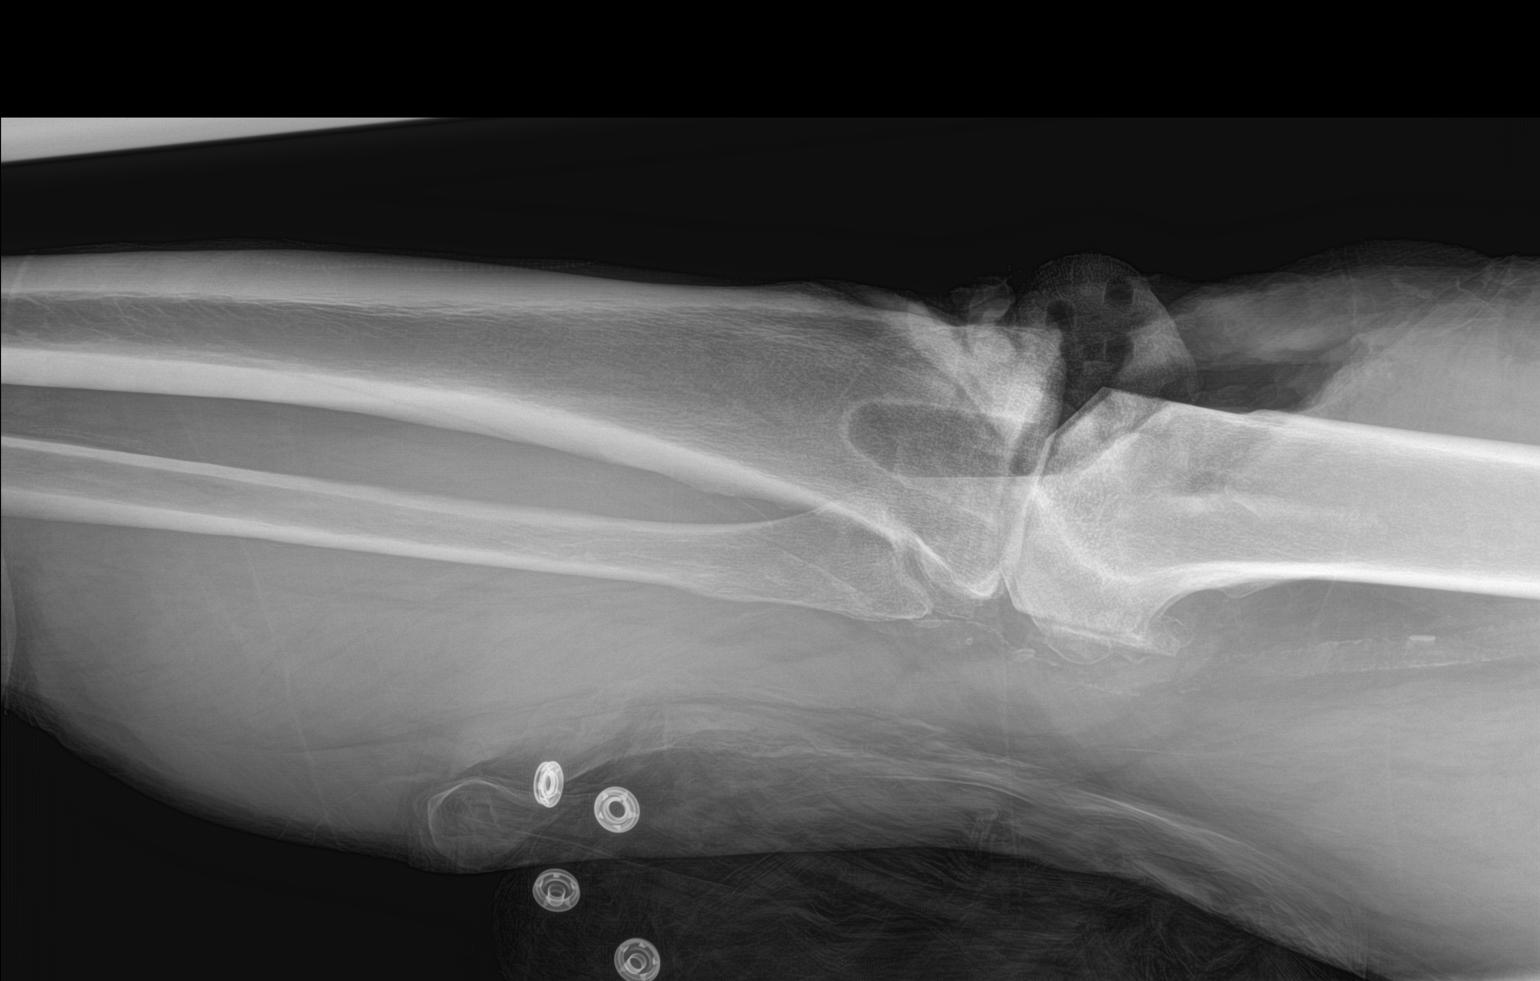

[1 of 1 positions shown; findings below may reference images not displayed]

FINDINGS: Single lateral view of the right knee is provided. Tract in the
proximal tibia for knee replacement is noted. Flattening of the
anterior and posterior margins of the distal right femur are seen
for femoral component of knee replacement. Three lucencies in the
patella are consistent with knee replacement. No retained hardware
is identified.
IMPRESSION: Negative for unexpected radiopaque foreign body.

## 2019-01-05 ENCOUNTER — Other Ambulatory Visit: Payer: Self-pay

## 2019-01-05 ENCOUNTER — Encounter: Payer: Self-pay | Admitting: Podiatry

## 2019-01-05 ENCOUNTER — Ambulatory Visit (INDEPENDENT_AMBULATORY_CARE_PROVIDER_SITE_OTHER): Payer: BC Managed Care – PPO | Admitting: Podiatry

## 2019-01-05 ENCOUNTER — Other Ambulatory Visit: Payer: Self-pay | Admitting: Podiatry

## 2019-01-05 ENCOUNTER — Ambulatory Visit (INDEPENDENT_AMBULATORY_CARE_PROVIDER_SITE_OTHER): Payer: BC Managed Care – PPO

## 2019-01-05 DIAGNOSIS — E0843 Diabetes mellitus due to underlying condition with diabetic autonomic (poly)neuropathy: Secondary | ICD-10-CM

## 2019-01-05 DIAGNOSIS — M79671 Pain in right foot: Secondary | ICD-10-CM

## 2019-01-05 DIAGNOSIS — M79672 Pain in left foot: Secondary | ICD-10-CM

## 2019-01-05 DIAGNOSIS — R600 Localized edema: Secondary | ICD-10-CM

## 2019-01-05 MED ORDER — GABAPENTIN 100 MG PO CAPS: 100.0000 mg | ORAL_CAPSULE | Freq: Every day | ORAL | 3 refills | Status: DC

## 2019-01-08 NOTE — Progress Notes (Signed)
   HPI: 63 y.o. male with PMHx of T2DM presenting today a chief complaint of intermittent sharp pain noted to the bilateral feet, left worse than right, that began 8-9 months ago. He reports occasional swelling of the feet. He states his toes feel tight constantly. Propping his feet up increases the pain. He has been soaking them in Epsom salt and taking Tylenol with no significant relief. Patient is here for further evaluation and treatment.   Past Medical History:  Diagnosis Date  . Appendicitis   . Arthritis   . Diabetes mellitus without complication ([place])   . Hypertension   . Sebaceous cyst      Physical Exam: General: The patient is alert and oriented x3 in no acute distress.  Dermatology: Skin is warm, dry and supple bilateral lower extremities. Negative for open lesions or macerations.  Vascular: Bilateral foot edema noted. Palpable pedal pulses bilaterally. No erythema noted. Capillary refill within normal limits.  Neurological: Epicritic and protective threshold grossly intact bilaterally.   Musculoskeletal Exam: Range of motion within normal limits to all pedal and ankle joints bilateral. Muscle strength 5/5 in all groups bilateral.   Radiographic Exam:  Normal osseous mineralization. Joint spaces preserved. No fracture/dislocation/boney destruction.    Assessment: 1. Diabetes mellitus with peripheral neuropathy 2. Bilateral foot edema   Plan of Care:  1. Patient evaluated. X-Rays reviewed.  2. Prescription for Gabapentin 100 mg QHS provided to patient.  3. Compression anklet dispensed.  4. Recommended good shoe gear.  5. Return to clinic as needed.       Edrick Kins, DPM [facility]  Dr. Edrick Kins, DPM    [address], [place] [address]                Office [phone]  Fax [phone]

## 2019-05-18 ENCOUNTER — Telehealth: Payer: Self-pay

## 2019-05-18 MED ORDER — GABAPENTIN 100 MG PO CAPS: 100.0000 mg | ORAL_CAPSULE | Freq: Every day | ORAL | 4 refills | Status: DC

## 2019-05-18 NOTE — Telephone Encounter (Signed)
Pharmacy refill request for Gabapentin 100mg .  Per Dr. verbal order, ok to refill.  Script has been sent to pharmacy

## 2019-08-02 ENCOUNTER — Ambulatory Visit: Payer: Self-pay | Attending: Internal Medicine

## 2019-08-02 ENCOUNTER — Other Ambulatory Visit: Payer: Self-pay

## 2019-08-02 DIAGNOSIS — Z23 Encounter for immunization: Secondary | ICD-10-CM

## 2019-08-02 NOTE — Progress Notes (Signed)
   Covid-19 Vaccination Clinic  Name:  Edwin Reed    MRN: [id number] DOB: 06/10/1955  08/02/2019  Edwin Reed was observed post Covid-19 immunization for 15 minutes without incident. He was provided with Vaccine Information Sheet and instruction to access the V-Safe system.   Edwin Reed was instructed to call 911 with any severe reactions post vaccine: Marland Kitchen Difficulty breathing  . Swelling of face and throat  . A fast heartbeat  . A bad rash all over body  . Dizziness and weakness   Immunizations Administered    Name Date Dose VIS Date Route   Pfizer COVID-19 Vaccine 08/02/2019 11:11 AM 0.3 mL 04/02/2019 Intramuscular   Manufacturer: [employer], [employer]   Lot: [id number]   NDC: [address]-4

## 2019-08-10 ENCOUNTER — Ambulatory Visit: Payer: BC Managed Care – PPO | Admitting: Podiatry

## 2019-08-24 ENCOUNTER — Ambulatory Visit: Payer: Self-pay

## 2019-08-25 ENCOUNTER — Ambulatory Visit: Payer: Self-pay | Attending: Internal Medicine

## 2019-08-25 DIAGNOSIS — Z23 Encounter for immunization: Secondary | ICD-10-CM

## 2019-08-25 NOTE — Progress Notes (Signed)
   Covid-19 Vaccination Clinic  Name:  Edwin Reed    MRN: [id number] DOB: 15-Sep-1955  08/25/2019  [name] was observed post Covid-19 immunization for 15 minutes without incident. He was provided with Vaccine Information Sheet and instruction to access the V-Safe system.   [name] was instructed to call 911 with any severe reactions post vaccine: Marland Kitchen Difficulty breathing  . Swelling of face and throat  . A fast heartbeat  . A bad rash all over body  . Dizziness and weakness   Immunizations Administered    Name Date Dose VIS Date Route   Pfizer COVID-19 Vaccine 08/25/2019 11:52 AM 0.3 mL 06/16/2018 Intramuscular   Manufacturer: [employer], [employer]   Lot: [id number]   NDC: [address]-0

## 2019-10-19 ENCOUNTER — Other Ambulatory Visit: Payer: Self-pay | Admitting: Podiatry

## 2024-04-25 ENCOUNTER — Encounter: Payer: Self-pay | Admitting: Emergency Medicine

## 2024-04-25 ENCOUNTER — Ambulatory Visit
Admission: EM | Admit: 2024-04-25 | Discharge: 2024-04-25 | Disposition: A | Discharge status: Home / Self Care | Attending: Emergency Medicine | Admitting: Emergency Medicine

## 2024-04-25 DIAGNOSIS — Z8639 Personal history of other endocrine, nutritional and metabolic disease: Secondary | ICD-10-CM

## 2024-04-25 DIAGNOSIS — M5441 Lumbago with sciatica, right side: Secondary | ICD-10-CM

## 2024-04-25 MED ORDER — METHOCARBAMOL 500 MG PO TABS: 500.0000 mg | ORAL_TABLET | Freq: Two times a day (BID) | ORAL | 0 refills | Status: DC

## 2024-04-25 MED ORDER — MELOXICAM 7.5 MG PO TABS: 7.5000 mg | ORAL_TABLET | Freq: Every day | ORAL | 0 refills | Status: DC

## 2024-04-25 NOTE — Discharge Instructions (Addendum)
 Take home meds as directed.  May use heat or ice to back for comfort 20 min 3 x daily.  May use lidocaine  patch or biofreeze for pain.  Please follow up with PCP, may need to referral to physical therapy for further back pain management.  GO immediately to nearest ER or call 9-1-1 for loss of bowel and bladder,loss of function, saddle numbness, etc.  Pt advised otc tylenol as label directed for pain Avoid lifting,turning,bending as this will aggravate your back

## 2024-04-25 NOTE — ED Triage Notes (Signed)
 Patient c/o right hip pain that radiates down his right leg that started last week but got worse yesterday.  Patient has been taking Tylenol .

## 2024-04-25 NOTE — ED Provider Notes (Signed)
 " [facility]    CSN: [id number] Arrival date & time: 04/25/24  0801      History   Chief Complaint Chief Complaint  Patient presents with   Hip Pain    right    HPI Edwin Reed is a 69 y.o. male.   69 year old male pt, Edwin Reed, presents to urgent care for evaluation of back and right hip pain x 1 week, got worse yesterday. Pt has hx of same last was 2023. Pt took Meloxicam  and symptoms improved, pt has PCP at [facility], pt reports normal labs. Pt denies saddle numbness, loss of function, or loss of bowel and bladder. Pt works as a actor.   PMH: Arthritis, DM, HTN  The history is provided by the patient. No language interpreter was used.  Hip Pain    Past Medical History:  Diagnosis Date   Appendicitis    Arthritis    Diabetes mellitus without complication (HCC)    Hypertension    Sebaceous cyst     Patient Active Problem List   Diagnosis Date Noted   History of diabetes mellitus 04/25/2024   Acute right-sided low back pain with right-sided sciatica 04/25/2024   Total knee replacement status 05/01/2016   Primary osteoarthritis of both knees 02/22/2016   Sebaceous cyst 03/23/2015   FOM (frequency of micturition) 07/14/2014   History of colon polyps 11/04/2013   ED (erectile dysfunction) of organic origin 01/08/2013   Type 2 diabetes mellitus (HCC) 06/28/2011   Adiposity 06/28/2011   Congested 06/28/2011   Benign hypertension 06/28/2011   Hyperlipidemia associated with type 2 diabetes mellitus (HCC) 06/28/2011   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    Past Surgical History:  Procedure Laterality Date   APPENDECTOMY  2015   BOWEL RESECTION  2014   removal of part of interestines   COLON SURGERY     JOINT REPLACEMENT     REPAIR OF PERFORATED ULCER  2014   gastric   TOTAL KNEE ARTHROPLASTY Left 05/01/2016   Procedure: TOTAL KNEE ARTHROPLASTY;  Surgeon: Kayla Pinal, MD;  Location: [facility] ORS;  Service:  Orthopedics;  Laterality: Left;   TOTAL KNEE ARTHROPLASTY Right 07/24/2016   Procedure: TOTAL KNEE ARTHROPLASTY;  Surgeon: Kayla Pinal, MD;  Location: [facility] ORS;  Service: Orthopedics;  Laterality: Right;       Home Medications    Prior to Admission medications  Medication Sig Start Date End Date Taking? Authorizing Provider  meloxicam  (MOBIC ) 7.5 MG tablet Take 1 tablet (7.5 mg total) by mouth daily for 7 days. 04/25/24 05/02/24 Yes Dawt Reeb, Rilla, NP  methocarbamol  (ROBAXIN ) 500 MG tablet Take 1 tablet (500 mg total) by mouth 2 (two) times daily for 7 days. 04/25/24 05/02/24 Yes Ayan Heffington, Rilla, NP  ACCU-CHEK AVIVA PLUS test strip TEST BLOOD SUGAR QD UTD 12/30/14   [provider]  amLODipine  (NORVASC ) 10 MG tablet Take 10 mg by mouth every morning.  01/24/15   [provider]  aspirin  EC 325 MG tablet Take 1 tablet (325 mg total) by mouth daily. 07/26/16   Pinal Kayla, MD  atorvastatin  (LIPITOR) 80 MG tablet Take 80 mg by mouth every evening.  06/29/14 07/17/16  [provider]  enalapril  (VASOTEC ) 20 MG tablet Take 20 mg by mouth every morning.  02/23/15 07/17/16  [provider]  fluticasone  (FLONASE ) 50 MCG/ACT nasal spray 2 sprays by Each Nare route daily as needed for allergies 11/21/14 07/17/16  [provider]  gabapentin  (NEURONTIN ) 100  MG capsule TAKE 1 CAPSULE BY MOUTH AT BEDTIMES 10/19/19   Janit Thresa HERO, DPM  glucose blood test strip  03/15/14   [provider]  hydrochlorothiazide  (HYDRODIURIL ) 25 MG tablet Take 25 mg by mouth daily. 03/15/16   [provider]  insulin  NPH-regular Human (NOVOLIN 70/30) (70-30) 100 UNIT/ML injection Inject 10-30 Units into the skin 2 (two) times daily. Inject 30 units in the morning and 10 units at night 07/24/15 07/18/16  [provider]  metFORMIN  (GLUCOPHAGE -XR) 500 MG 24 hr tablet Take 500 mg by mouth 2 (two) times daily.    [provider]  metoprolol  (LOPRESSOR ) 50 MG  tablet Take 75 mg by mouth 2 (two) times daily. 02/23/15 07/17/16  [provider]  Multiple Vitamin (MULTIVITAMIN WITH MINERALS) TABS tablet Take 1 tablet by mouth daily.    [provider]  sildenafil  (VIAGRA ) 100 MG tablet Take 100 mg by mouth as needed for erectile dysfunction.  08/08/14   [provider]  VICTOZA  18 MG/3ML SOPN Inject 1.8 mg into the skin every morning. 03/15/16   [provider]  Vitamins/Minerals TABS Take by mouth.    [provider]    Family History Family History  Problem Relation Age of Onset   Hypertension Mother     Social History Social History[1]   Allergies   Patient has no known allergies.   Review of Systems Review of Systems  Constitutional:  Negative for fever.  Musculoskeletal:  Positive for back pain and myalgias.  Neurological:  Negative for weakness and numbness.  All other systems reviewed and are negative.    Physical Exam Triage Vital Signs ED Triage Vitals  Encounter Vitals Group     BP      Girls Systolic BP Percentile      Girls Diastolic BP Percentile      Boys Systolic BP Percentile      Boys Diastolic BP Percentile      Pulse      Resp      Temp      Temp src      SpO2      Weight      Height      Head Circumference      Peak Flow      Pain Score      Pain Loc      Pain Education      Exclude from Growth Chart    No data found.  Updated Vital Signs BP (!) 188/84 (BP Location: Right Arm)   Pulse (!) 57   Temp 98 F (36.7 C) (Oral)   Ht 6' 1 (1.854 m)   Wt 235 lb (106.6 kg)   SpO2 98%   BMI 31.00 kg/m   Visual Acuity Right Eye Distance:   Left Eye Distance:   Bilateral Distance:    Right Eye Near:   Left Eye Near:    Bilateral Near:     Physical Exam Vitals and nursing note reviewed.  Constitutional:      General: He is not in acute distress.    Appearance: He is well-developed and well-groomed.  HENT:     Head: Normocephalic and atraumatic.   Eyes:     Conjunctiva/sclera: Conjunctivae normal.  Cardiovascular:     Rate and Rhythm: Normal rate and regular rhythm.     Pulses:          Dorsalis pedis pulses are 2+ on the right side and 2+ on the left  side.     Heart sounds: No murmur heard. Pulmonary:     Effort: Pulmonary effort is normal. No respiratory distress.     Breath sounds: Normal breath sounds.  Abdominal:     Palpations: Abdomen is soft.     Tenderness: There is no abdominal tenderness.  Musculoskeletal:        General: No swelling.     Cervical back: Neck supple.     Lumbar back: Spasms and tenderness present. No swelling, edema, deformity, signs of trauma, lacerations or bony tenderness. Normal range of motion. Positive right straight leg raise test. Negative left straight leg raise test. No scoliosis.  Skin:    General: Skin is warm and dry.     Capillary Refill: Capillary refill takes less than 2 seconds.  Neurological:     General: No focal deficit present.     Mental Status: He is alert and oriented to person, place, and time.     GCS: GCS eye subscore is 4. GCS verbal subscore is 5. GCS motor subscore is 6.     Cranial Nerves: No cranial nerve deficit.     Sensory: Sensation is intact.     Motor: Motor function is intact.     Gait: Gait is intact.  Psychiatric:        Attention and Perception: Attention normal.        Mood and Affect: Mood normal.        Speech: Speech normal.        Behavior: Behavior normal. Behavior is cooperative.      UC Treatments / Results  Labs (all labs ordered are listed, but only abnormal results are displayed) Labs Reviewed - No data to display  EKG   Radiology No results found.  Procedures Procedures (including critical care time)  Medications Ordered in UC Medications - No data to display  Initial Impression / Assessment and Plan / UC Course  I have reviewed the triage vital signs and the nursing notes.  Pertinent labs & imaging results that were  available during my care of the patient were reviewed by me and considered in my medical decision making (see chart for details).    Discussed exam findings and plan of care with patient, will treat with meloxicam  and Robaxin  take as directed , strict go to ER precautions given.   Patient verbalized understanding to this provider.  Work note given.  Ddx: Acute right-sided low back pain with right-sided sciatica, muscle spasm , muscle strain , history of diabetes, history of hypertension Final Clinical Impressions(s) / UC Diagnoses   Final diagnoses:  History of diabetes mellitus  Acute right-sided low back pain with right-sided sciatica     Discharge Instructions      Take home meds as directed.  May use heat or ice to back for comfort 20 min 3 x daily.  May use lidocaine  patch or biofreeze for pain.  Please follow up with PCP, may need to referral to physical therapy for further back pain management.  GO immediately to nearest ER or call 9-1-1 for loss of bowel and bladder,loss of function, saddle numbness, etc.  Pt advised otc tylenol  as label directed for pain Avoid lifting,turning,bending as this will aggravate your back     ED Prescriptions     Medication Sig Dispense Auth. Provider   meloxicam  (MOBIC ) 7.5 MG tablet Take 1 tablet (7.5 mg total) by mouth daily for 7 days. 7 tablet Marveen Donlon, NP   methocarbamol  (ROBAXIN ) 500  MG tablet Take 1 tablet (500 mg total) by mouth 2 (two) times daily for 7 days. 14 tablet Sasha Rogel, Rilla, NP      PDMP not reviewed this encounter.     [1]  Social History Tobacco Use   Smoking status: Former    Current packs/day: 0.00    Types: Cigarettes    Quit date: 04/19/1996    Years since quitting: 28.0   Smokeless tobacco: Never  Substance Use Topics   Alcohol use: Yes    Comment: 1 beer every other month   Drug use: No     Louise Rawson, Rilla, NP 04/25/24 0841  "

## 2024-04-30 ENCOUNTER — Other Ambulatory Visit: Payer: Self-pay

## 2024-04-30 ENCOUNTER — Emergency Department: Admission: EM | Admit: 2024-04-30 | Discharge: 2024-04-30 | Disposition: A | Discharge status: Home / Self Care

## 2024-04-30 DIAGNOSIS — I10 Essential (primary) hypertension: Secondary | ICD-10-CM | POA: Diagnosis not present

## 2024-04-30 DIAGNOSIS — M5431 Sciatica, right side: Secondary | ICD-10-CM

## 2024-04-30 DIAGNOSIS — E119 Type 2 diabetes mellitus without complications: Secondary | ICD-10-CM | POA: Diagnosis not present

## 2024-04-30 DIAGNOSIS — M545 Low back pain, unspecified: Secondary | ICD-10-CM | POA: Diagnosis present

## 2024-04-30 DIAGNOSIS — M5441 Lumbago with sciatica, right side: Secondary | ICD-10-CM | POA: Diagnosis not present

## 2024-04-30 MED ORDER — CYCLOBENZAPRINE HCL 5 MG PO TABS: 5.0000 mg | ORAL_TABLET | Freq: Every day | ORAL | 0 refills | Status: AC

## 2024-04-30 MED ORDER — KETOROLAC TROMETHAMINE 30 MG/ML IJ SOLN
30.0000 mg | Freq: Once | INTRAMUSCULAR | Status: AC
  Administered 2024-04-30: 30 mg via INTRAMUSCULAR
  Filled 2024-04-30: qty 1

## 2024-04-30 MED ORDER — GABAPENTIN 100 MG PO CAPS: 100.0000 mg | ORAL_CAPSULE | Freq: Every day | ORAL | 0 refills | Status: AC

## 2024-04-30 MED ORDER — DICLOFENAC SODIUM 75 MG PO TBEC: 75.0000 mg | DELAYED_RELEASE_TABLET | Freq: Two times a day (BID) | ORAL | 0 refills | Status: AC

## 2024-04-30 NOTE — ED Provider Notes (Signed)
 "   [facility] Emergency Department Provider Note     Event Date/Time   First MD Initiated Contact with Patient 04/30/24 1634     (approximate)   History   Hip Pain   HPI  IOSEFA WEINTRAUB is a 69 y.o. male with a history of HTN, HLD, DM type II, arthritis, obesity, erectile dysfunction, and sciatica presents to the ED for evaluation of right sided low back pain with referral down the RLE.  Patient was recently seen at a local urgent care, and reports they used 1 week of medications as prescribed.  Patient is also currently working in a job that requires a lot of walking and standing.  He denies any longstanding problems but has had remote issues with sciatica flares on the same side.  Patient denies any recent injury, trauma, or fall.  No bladder or bowel incontinence, foot drop, or saddle anesthesia.   Physical Exam   Triage Vital Signs: ED Triage Vitals  Encounter Vitals Group     BP 04/30/24 1536 (!) 173/77     Girls Systolic BP Percentile --      Girls Diastolic BP Percentile --      Boys Systolic BP Percentile --      Boys Diastolic BP Percentile --      Pulse Rate 04/30/24 1536 66     Resp 04/30/24 1536 16     Temp 04/30/24 1536 98.2 F (36.8 C)     Temp src --      SpO2 04/30/24 1536 100 %     Weight --      Height --      Head Circumference --      Peak Flow --      Pain Score 04/30/24 1528 9     Pain Loc --      Pain Education --      Exclude from Growth Chart --     Most recent vital signs: Vitals:   04/30/24 1536  BP: (!) 173/77  Pulse: 66  Resp: 16  Temp: 98.2 F (36.8 C)  SpO2: 100%    General Awake, no distress. NAD HEENT NCAT. PERRL. EOMI. No rhinorrhea. Mucous membranes are moist.  CV:  Good peripheral perfusion. RRR RESP:  Normal effort. CTA ABD:  No distention.  MSK:  Normal spinal alignment without midline tenderness, spasm, deformities, or step-off.  AROM of all extremities.  Normal hip flexion and extension  range on exam. NEURO: Cranial nerves II to XII grossly intact.  Normal LE DTRs bilaterally.  Normal toe dorsiflexion exam.   ED Results / Procedures / Treatments   Labs (all labs ordered are listed, but only abnormal results are displayed) Labs Reviewed - No data to display   EKG   RADIOLOGY    PROCEDURES:  Critical Care performed: No  Procedures   MEDICATIONS ORDERED IN ED: Medications  ketorolac  (TORADOL ) 30 MG/ML injection 30 mg (30 mg Intramuscular Given 04/30/24 1736)     IMPRESSION / MDM / ASSESSMENT AND PLAN / ED COURSE  I reviewed the triage vital signs and the nursing notes.                              Differential diagnosis includes, but is not limited to, sciatica, lumbar radiculopathy, myalgias, DJD  Patient's presentation is most consistent with acute, uncomplicated illness.  Patient's diagnosis is consistent with right sided sciatica.  Patient presents  with recurrence of symptoms similar to previous sciatica flares.  Nontraumatic in nature, with no red flags on exam.  Patient with reassuring exam overall.  No indication for any invasive imaging at this time.  Patient will be discharged home with prescriptions for cyclobenzaprine , diclofenac , and gabapentin . Patient is to follow up with his primary provider as suggested, as needed or otherwise directed. Patient is given ED precautions to return to the ED for any worsening or new symptoms.     FINAL CLINICAL IMPRESSION(S) / ED DIAGNOSES   Final diagnoses:  Sciatica of right side     Rx / DC Orders   ED Discharge Orders          Ordered    gabapentin  (NEURONTIN ) 100 MG capsule  Daily at bedtime        04/30/24 1721    cyclobenzaprine  (FLEXERIL ) 5 MG tablet  Daily at bedtime        04/30/24 1721    diclofenac  (VOLTAREN ) 75 MG EC tablet  2 times daily        04/30/24 1721             Note:  This document was prepared using Dragon voice recognition software and may include unintentional  dictation errors.    Loyd Candida LULLA Aldona, PA-C 04/30/24 2200  "

## 2024-04-30 NOTE — Discharge Instructions (Addendum)
 Take the prescription meds as directed. Follow-up with your primary provider for ongoing evaluation.

## 2024-04-30 NOTE — ED Triage Notes (Signed)
 Pt presents to ED from home C/O R hip pain radiating down R leg. States out of meds given by urgent care a few days ago for same.
